# Patient Record
Sex: Male | Born: 1960 | Race: White | Hispanic: No | Marital: Married | Smoking: Never smoker
Health system: Southern US, Community
[De-identification: ages and names within clinical notes are randomized; demographics above are authoritative.]

## PROBLEM LIST (undated history)

## (undated) DIAGNOSIS — K219 Gastro-esophageal reflux disease without esophagitis: Secondary | ICD-10-CM

## (undated) DIAGNOSIS — Z978 Presence of other specified devices: Secondary | ICD-10-CM

## (undated) DIAGNOSIS — N410 Acute prostatitis: Secondary | ICD-10-CM

## (undated) DIAGNOSIS — I4891 Unspecified atrial fibrillation: Secondary | ICD-10-CM

## (undated) DIAGNOSIS — C6991 Malignant neoplasm of unspecified site of right eye: Secondary | ICD-10-CM

## (undated) DIAGNOSIS — Z960 Presence of urogenital implants: Secondary | ICD-10-CM

## (undated) DIAGNOSIS — N400 Enlarged prostate without lower urinary tract symptoms: Secondary | ICD-10-CM

## (undated) HISTORY — DX: Acute prostatitis: N41.0

## (undated) HISTORY — DX: Malignant neoplasm of unspecified site of right eye: C69.91

## (undated) HISTORY — DX: Unspecified atrial fibrillation: I48.91

---

## 2003-07-25 HISTORY — PX: ENUCLEATION: SHX628

## 2004-05-20 ENCOUNTER — Encounter: Admission: RE | Admit: 2004-05-20 | Discharge: 2004-05-20 | Payer: Self-pay | Admitting: Family Medicine

## 2004-06-23 DIAGNOSIS — C6991 Malignant neoplasm of unspecified site of right eye: Secondary | ICD-10-CM

## 2004-06-23 HISTORY — DX: Malignant neoplasm of unspecified site of right eye: C69.91

## 2004-07-08 ENCOUNTER — Ambulatory Visit (HOSPITAL_COMMUNITY): Admission: RE | Admit: 2004-07-08 | Discharge: 2004-07-08 | Payer: Self-pay | Admitting: Ophthalmology

## 2005-08-04 ENCOUNTER — Encounter: Admission: RE | Admit: 2005-08-04 | Discharge: 2005-08-04 | Payer: Self-pay | Admitting: Family Medicine

## 2006-03-12 ENCOUNTER — Encounter: Admission: RE | Admit: 2006-03-12 | Discharge: 2006-03-12 | Payer: Self-pay | Admitting: Family Medicine

## 2006-06-19 ENCOUNTER — Encounter: Admission: RE | Admit: 2006-06-19 | Discharge: 2006-06-19 | Payer: Self-pay | Admitting: General Surgery

## 2006-06-21 ENCOUNTER — Ambulatory Visit (HOSPITAL_BASED_OUTPATIENT_CLINIC_OR_DEPARTMENT_OTHER): Admission: RE | Admit: 2006-06-21 | Discharge: 2006-06-21 | Payer: Self-pay | Admitting: General Surgery

## 2008-07-24 HISTORY — PX: HERNIA REPAIR: SHX51

## 2010-09-13 ENCOUNTER — Emergency Department (HOSPITAL_COMMUNITY)
Admission: EM | Admit: 2010-09-13 | Discharge: 2010-09-13 | Disposition: A | Discharge status: Home / Self Care | Payer: BC Managed Care – PPO | Attending: Emergency Medicine | Admitting: Emergency Medicine

## 2010-09-13 DIAGNOSIS — M79609 Pain in unspecified limb: Secondary | ICD-10-CM | POA: Insufficient documentation

## 2010-09-13 DIAGNOSIS — M545 Low back pain, unspecified: Secondary | ICD-10-CM | POA: Insufficient documentation

## 2010-09-13 DIAGNOSIS — M51379 Other intervertebral disc degeneration, lumbosacral region without mention of lumbar back pain or lower extremity pain: Secondary | ICD-10-CM | POA: Insufficient documentation

## 2010-09-13 DIAGNOSIS — M5137 Other intervertebral disc degeneration, lumbosacral region: Secondary | ICD-10-CM | POA: Insufficient documentation

## 2010-09-22 ENCOUNTER — Ambulatory Visit
Admission: RE | Admit: 2010-09-22 | Discharge: 2010-09-22 | Disposition: A | Discharge status: Home / Self Care | Payer: BC Managed Care – PPO | Source: Ambulatory Visit | Attending: Family Medicine | Admitting: Family Medicine

## 2010-09-22 ENCOUNTER — Other Ambulatory Visit: Payer: Self-pay | Admitting: Family Medicine

## 2010-09-22 DIAGNOSIS — M545 Low back pain: Secondary | ICD-10-CM

## 2014-06-12 ENCOUNTER — Emergency Department: Payer: Self-pay | Admitting: Emergency Medicine

## 2014-06-21 ENCOUNTER — Emergency Department: Payer: Self-pay | Admitting: Emergency Medicine

## 2015-01-22 HISTORY — PX: TRANSTHORACIC ECHOCARDIOGRAM: SHX275

## 2015-02-04 ENCOUNTER — Emergency Department (HOSPITAL_COMMUNITY): Payer: BLUE CROSS/BLUE SHIELD

## 2015-02-04 ENCOUNTER — Emergency Department (HOSPITAL_COMMUNITY)
Admission: EM | Admit: 2015-02-04 | Discharge: 2015-02-04 | Disposition: A | Discharge status: Home / Self Care | Payer: BLUE CROSS/BLUE SHIELD | Attending: Emergency Medicine | Admitting: Emergency Medicine

## 2015-02-04 ENCOUNTER — Encounter (HOSPITAL_COMMUNITY): Payer: Self-pay | Admitting: Neurology

## 2015-02-04 DIAGNOSIS — I4891 Unspecified atrial fibrillation: Secondary | ICD-10-CM | POA: Insufficient documentation

## 2015-02-04 DIAGNOSIS — I481 Persistent atrial fibrillation: Secondary | ICD-10-CM | POA: Diagnosis not present

## 2015-02-04 DIAGNOSIS — R079 Chest pain, unspecified: Secondary | ICD-10-CM | POA: Diagnosis present

## 2015-02-04 HISTORY — DX: Unspecified atrial fibrillation: I48.91

## 2015-02-04 LAB — CBC WITH DIFFERENTIAL/PLATELET
Basophils Absolute: 0.1 10*3/uL (ref 0.0–0.1)
Basophils Relative: 1 % (ref 0–1)
Eosinophils Absolute: 0.2 10*3/uL (ref 0.0–0.7)
Eosinophils Relative: 3 % (ref 0–5)
HCT: 49.9 % (ref 39.0–52.0)
Hemoglobin: 17.9 g/dL — ABNORMAL HIGH (ref 13.0–17.0)
Lymphocytes Relative: 34 % (ref 12–46)
Lymphs Abs: 2.9 10*3/uL (ref 0.7–4.0)
MCH: 33 pg (ref 26.0–34.0)
MCHC: 35.9 g/dL (ref 30.0–36.0)
MCV: 91.9 fL (ref 78.0–100.0)
Monocytes Absolute: 1 10*3/uL (ref 0.1–1.0)
Monocytes Relative: 12 % (ref 3–12)
Neutro Abs: 4.3 10*3/uL (ref 1.7–7.7)
Neutrophils Relative %: 50 % (ref 43–77)
Platelets: 294 10*3/uL (ref 150–400)
RBC: 5.43 MIL/uL (ref 4.22–5.81)
RDW: 12.1 % (ref 11.5–15.5)
WBC: 8.6 10*3/uL (ref 4.0–10.5)

## 2015-02-04 LAB — BASIC METABOLIC PANEL
Anion gap: 12 (ref 5–15)
BUN: 12 mg/dL (ref 6–20)
CO2: 22 mmol/L (ref 22–32)
Calcium: 8.9 mg/dL (ref 8.9–10.3)
Chloride: 105 mmol/L (ref 101–111)
Creatinine, Ser: 0.97 mg/dL (ref 0.61–1.24)
GFR calc Af Amer: >60 mL/min (ref >60)
GFR calc non Af Amer: >60 mL/min (ref >60)
Glucose, Bld: 136 mg/dL — ABNORMAL HIGH (ref 65–99)
Potassium: 4.3 mmol/L (ref 3.5–5.1)
Sodium: 139 mmol/L (ref 135–145)

## 2015-02-04 LAB — TSH: TSH: 1.463 u[IU]/mL (ref 0.350–4.500)

## 2015-02-04 LAB — T4, FREE: Free T4: 0.86 ng/dL (ref 0.61–1.12)

## 2015-02-04 LAB — BRAIN NATRIURETIC PEPTIDE: B Natriuretic Peptide: 25.4 pg/mL (ref 0.0–100.0)

## 2015-02-04 LAB — URINALYSIS, ROUTINE W REFLEX MICROSCOPIC
Bilirubin Urine: NEGATIVE
Glucose, UA: NEGATIVE mg/dL
Hgb urine dipstick: NEGATIVE
Ketones, ur: NEGATIVE mg/dL
Leukocytes, UA: NEGATIVE
Nitrite: NEGATIVE
Protein, ur: NEGATIVE mg/dL
Specific Gravity, Urine: 1.009 (ref 1.005–1.030)
Urobilinogen, UA: 0.2 mg/dL (ref 0.0–1.0)
pH: 7 (ref 5.0–8.0)

## 2015-02-04 LAB — TROPONIN I: Troponin I: <0.03 ng/mL (ref <0.031)

## 2015-02-04 LAB — I-STAT TROPONIN, ED: Troponin i, poc: 0 ng/mL (ref 0.00–0.08)

## 2015-02-04 MED ORDER — SODIUM CHLORIDE 0.9 % IV BOLUS (SEPSIS)
1000.0000 mL | INTRAVENOUS | Status: AC
  Administered 2015-02-04: 1000 mL via INTRAVENOUS

## 2015-02-04 MED ORDER — FLECAINIDE ACETATE 100 MG PO TABS
300.0000 mg | ORAL_TABLET | Freq: Once | ORAL | Status: AC
  Administered 2015-02-04: 300 mg via ORAL
  Filled 2015-02-04: qty 3

## 2015-02-04 MED ORDER — DILTIAZEM HCL 25 MG/5ML IV SOLN
15.0000 mg | Freq: Once | INTRAVENOUS | Status: AC
  Administered 2015-02-04: 15 mg via INTRAVENOUS

## 2015-02-04 MED ORDER — SODIUM CHLORIDE 0.9 % IV BOLUS (SEPSIS)
500.0000 mL | Freq: Once | INTRAVENOUS | Status: AC
  Administered 2015-02-04: 500 mL via INTRAVENOUS

## 2015-02-04 MED ORDER — FLECAINIDE ACETATE 150 MG PO TABS: ORAL_TABLET | ORAL | Status: DC

## 2015-02-04 MED ORDER — DILTIAZEM HCL 25 MG/5ML IV SOLN
10.0000 mg | Freq: Once | INTRAVENOUS | Status: DC
  Filled 2015-02-04: qty 5

## 2015-02-04 MED ORDER — DEXTROSE 5 % IV SOLN
5.0000 mg/h | Freq: Once | INTRAVENOUS | Status: AC
  Administered 2015-02-04: 5 mg/h via INTRAVENOUS

## 2015-02-04 NOTE — Discharge Instructions (Signed)

## 2015-02-04 NOTE — Progress Notes (Signed)
Echocardiogram 2D Echocardiogram has been performed.  Tresa Res 02/04/2015, 1:02 PM

## 2015-02-04 NOTE — ED Notes (Signed)
HR decreased into the 100-140s after Cardizem bolus.

## 2015-02-04 NOTE — ED Notes (Signed)
Pt reports sob since 0300 last night and cp. HR 180 in triage. C/o dizziness

## 2015-02-04 NOTE — Consult Note (Signed)
Primary Physician: Primary Cardiologist:  New   Pt presents to ER for palpitations since 3 AM    HPI:  Pt is a 54 yo who has no prior history of arrhythmia or other cardiac problems  Woke up last night to go to the bathroom and heart was racing.  Came to ER for evaluation  Has not had in the past Notes drinking 3 to 4 beers per night that he does do intermittently  No prior hisotry of palpitations.  No SOB  No CP  No syncope     History reviewed. No pertinent past medical history.   (Not in a hospital admission)     Infusions:   Allergies  Allergen Reactions  . Nyquil Multi-Symptom [Pseudoeph-Doxylamine-Dm-Apap] Other (See Comments)    Intolerance- nervous    History   Social History  . Marital Status: Married    Spouse Name: N/A  . Number of Children: N/A  . Years of Education: N/A   Occupational History  . Not on file.   Social History Main Topics  . Smoking status: Never Smoker   . Smokeless tobacco: Not on file  . Alcohol Use: Yes  . Drug Use: Not on file  . Sexual Activity: Not on file   Other Topics Concern  . Not on file   Social History Narrative  . No narrative on file    No family history on file.  REVIEW OF SYSTEMS:  All systems reviewed  Negative to the above problem except as noted above.   Does have a history of prostatitis and is being evalauted for this  Has tried 2 courses of ABx  Symptoms have returned since stopping .   PHYSICAL EXAM: Filed Vitals:   02/04/15 1402  BP: 100/79  Pulse: 106  Temp:   Resp: 17     Intake/Output Summary (Last 24 hours) at 02/04/15 1416 Last data filed at 02/04/15 1022  Gross per 24 hour  Intake   1000 ml  Output      0 ml  Net   1000 ml    General:  Well appearing. No respiratory difficulty HEENT: normal Neck: supple. no JVD. Carotids 2+ bilat; no bruits. No lymphadenopathy or thryomegaly appreciated. Cor: PMI nondisplaced. Irregular rate & rhythm. No rubs, gallops or murmurs. Lungs:  clear Abdomen: soft, Mild tenderness in the pelvic region  No rebound   No hepatosplenomegaly. No bruits or masses. Good bowel sounds. Extremities: no cyanosis, clubbing, rash, edema Neuro: alert & oriented x 3, cranial nerves grossly intact. moves all 4 extremities w/o difficulty. Affect pleasant.  ECG:  Atrial fib 171 bpm    Results for orders placed or performed during the hospital encounter of 02/04/15 (from the past 24 hour(s))  CBC with Differential     Status: Abnormal   Collection Time: 02/04/15  8:15 AM  Result Value Ref Range   WBC 8.6 4.0 - 10.5 K/uL   RBC 5.43 4.22 - 5.81 MIL/uL   Hemoglobin 17.9 (H) 13.0 - 17.0 g/dL   HCT 49.9 39.0 - 52.0 %   MCV 91.9 78.0 - 100.0 fL   MCH 33.0 26.0 - 34.0 pg   MCHC 35.9 30.0 - 36.0 g/dL   RDW 12.1 11.5 - 15.5 %   Platelets 294 150 - 400 K/uL   Neutrophils Relative % 50 43 - 77 %   Neutro Abs 4.3 1.7 - 7.7 K/uL   Lymphocytes Relative 34 12 - 46 %   Lymphs Abs 2.9  0.7 - 4.0 K/uL   Monocytes Relative 12 3 - 12 %   Monocytes Absolute 1.0 0.1 - 1.0 K/uL   Eosinophils Relative 3 0 - 5 %   Eosinophils Absolute 0.2 0.0 - 0.7 K/uL   Basophils Relative 1 0 - 1 %   Basophils Absolute 0.1 0.0 - 0.1 K/uL  Brain natriuretic peptide     Status: None   Collection Time: 02/04/15  8:15 AM  Result Value Ref Range   B Natriuretic Peptide 25.4 0.0 - 100.0 pg/mL  Basic metabolic panel     Status: Abnormal   Collection Time: 02/04/15  8:15 AM  Result Value Ref Range   Sodium 139 135 - 145 mmol/L   Potassium 4.3 3.5 - 5.1 mmol/L   Chloride 105 101 - 111 mmol/L   CO2 22 22 - 32 mmol/L   Glucose, Bld 136 (H) 65 - 99 mg/dL   BUN 12 6 - 20 mg/dL   Creatinine, Ser 0.97 0.61 - 1.24 mg/dL   Calcium 8.9 8.9 - 10.3 mg/dL   GFR calc non Af Amer >60 >60 mL/min   GFR calc Af Amer >60 >60 mL/min   Anion gap 12 5 - 15  Troponin I     Status: None   Collection Time: 02/04/15  8:15 AM  Result Value Ref Range   Troponin I <0.03 <0.031 ng/mL  I-stat troponin,  ED     Status: None   Collection Time: 02/04/15  8:28 AM  Result Value Ref Range   Troponin i, poc 0.00 0.00 - 0.08 ng/mL   Comment 3          T4, free     Status: None   Collection Time: 02/04/15  8:32 AM  Result Value Ref Range   Free T4 0.86 0.61 - 1.12 ng/dL  TSH     Status: None   Collection Time: 02/04/15  8:33 AM  Result Value Ref Range   TSH 1.463 0.350 - 4.500 uIU/mL  Urinalysis, Routine w reflex microscopic (not at [facility])     Status: Abnormal   Collection Time: 02/04/15 11:35 AM  Result Value Ref Range   Color, Urine YELLOW YELLOW   APPearance CLOUDY (A) CLEAR   Specific Gravity, Urine 1.009 1.005 - 1.030   pH 7.0 5.0 - 8.0   Glucose, UA NEGATIVE NEGATIVE mg/dL   Hgb urine dipstick NEGATIVE NEGATIVE   Bilirubin Urine NEGATIVE NEGATIVE   Ketones, ur NEGATIVE NEGATIVE mg/dL   Protein, ur NEGATIVE NEGATIVE mg/dL   Urobilinogen, UA 0.2 0.0 - 1.0 mg/dL   Nitrite NEGATIVE NEGATIVE   Leukocytes, UA NEGATIVE NEGATIVE   Dg Chest Portable 1 View  02/04/2015   CLINICAL DATA:  Shortness of breath starting this morning 3 a.m.  EXAM: PORTABLE CHEST - 1 VIEW  COMPARISON:  06/19/2006  FINDINGS: Cardiomediastinal silhouette is stable. No acute infiltrate or pleural effusion. No pulmonary edema. Bony thorax is unremarkable.  IMPRESSION: No active disease.   Electronically Signed   By: Lahoma Crocker M.D.   On: 02/04/2015 08:53     ASSESSMENT:  Pt is a 54 yo with new atrial fibrillation  May have been exacerbated by EtOH ingestion last night  Thyroid function normal  Except for elevated HR exam is unremarkable.   I have reviewed echo  LV size and systolic function are normal  RVEF is normal  LA and RA are normal  No valve abnormalities   CHADSVASc of 0.  He is now on  IV diltiazem and rates are better but still in afib  Would recomm trial of flecanide 300 mg   Continue telemetry.  If does not work in converting in a couple hours would admit for further care.    Dorris Carnes

## 2015-02-04 NOTE — ED Provider Notes (Addendum)
CSN: [id number]     Arrival date & time 02/04/15  0757 History   First MD Initiated Contact with Patient 02/04/15 0813     Chief Complaint  Patient presents with  . Shortness of Breath  . Chest Pain     (Consider location/radiation/quality/duration/timing/severity/associated sxs/prior Treatment) Patient is a 54 y.o. male presenting with shortness of breath and chest pain. The history is provided by the patient.  Shortness of Breath Severity:  Mild Onset quality:  Sudden Duration:  6 hours Timing:  Constant Progression:  Unchanged Chronicity:  New Context comment:  When getting up to urinate Relieved by:  Nothing Worsened by:  Nothing tried Ineffective treatments:  None tried Associated symptoms: no abdominal pain, no chest pain, no cough, no fever, no headaches, no neck pain and no vomiting   Chest Pain Associated symptoms: dizziness, palpitations and shortness of breath   Associated symptoms: no abdominal pain, no cough, no fever, no headache, no nausea, no numbness and not vomiting     History reviewed. No pertinent past medical history. History reviewed. No pertinent past surgical history. No family history on file. History  Substance Use Topics  . Smoking status: Never Smoker   . Smokeless tobacco: Not on file  . Alcohol Use: Yes    Review of Systems  Constitutional: Negative for fever.  HENT: Negative for drooling and rhinorrhea.   Eyes: Negative for pain.  Respiratory: Positive for shortness of breath. Negative for cough.   Cardiovascular: Positive for palpitations. Negative for chest pain and leg swelling.  Gastrointestinal: Negative for nausea, vomiting, abdominal pain and diarrhea.  Genitourinary: Negative for dysuria and hematuria.  Musculoskeletal: Negative for gait problem and neck pain.  Skin: Negative for color change.  Neurological: Positive for dizziness. Negative for numbness and headaches.  Hematological: Negative for adenopathy.   Psychiatric/Behavioral: Negative for behavioral problems.  All other systems reviewed and are negative.     Allergies  Review of patient's allergies indicates no known allergies.  Home Medications   Prior to Admission medications   Not on File   BP 113/84 mmHg  Pulse 61  Temp(Src) 97.8 F (36.6 C) (Oral)  Resp 18  SpO2 100% Physical Exam  Constitutional: He is oriented to person, place, and time. He appears well-developed and well-nourished.  HENT:  Head: Normocephalic and atraumatic.  Right Ear: External ear normal.  Left Ear: External ear normal.  Nose: Nose normal.  Mouth/Throat: Oropharynx is clear and moist. No oropharyngeal exudate.  Eyes: Conjunctivae and EOM are normal. Pupils are equal, round, and reactive to light.  Neck: Normal range of motion. Neck supple.  Cardiovascular: Normal heart sounds and intact distal pulses.  Exam reveals no gallop and no friction rub.   No murmur heard. HR 160's-170's, irreg irreg  Pulmonary/Chest: Effort normal and breath sounds normal. No respiratory distress. He has no wheezes.  Abdominal: Soft. Bowel sounds are normal. He exhibits no distension. There is no tenderness. There is no rebound and no guarding.  Musculoskeletal: Normal range of motion. He exhibits no edema or tenderness.  Neurological: He is alert and oriented to person, place, and time.  Skin: Skin is warm and dry.  Psychiatric: He has a normal mood and affect. His behavior is normal.  Nursing note and vitals reviewed.   ED Course  Procedures (including critical care time) Labs Review Labs Reviewed  CBC WITH DIFFERENTIAL/PLATELET - Abnormal; Notable for the following:    Hemoglobin 17.9 (*)    All other components within normal  limits  BRAIN NATRIURETIC PEPTIDE  BASIC METABOLIC PANEL  TROPONIN I  T4, FREE  TSH  I-STAT TROPOININ, ED    Imaging Review Dg Chest Portable 1 View  02/04/2015   CLINICAL DATA:  Shortness of breath starting this morning 3 a.m.   EXAM: PORTABLE CHEST - 1 VIEW  COMPARISON:  06/19/2006  FINDINGS: Cardiomediastinal silhouette is stable. No acute infiltrate or pleural effusion. No pulmonary edema. Bony thorax is unremarkable.  IMPRESSION: No active disease.   Electronically Signed   By: Lahoma Crocker M.D.   On: 02/04/2015 08:53     EKG Interpretation   Date/Time:  Thursday February 04 2015 08:04:42 EDT Ventricular Rate:  181 PR Interval:    QRS Duration: 76 QT Interval:  250 QTC Calculation: 434 R Axis:   65 Text Interpretation:  Atrial fibrillation with rapid ventricular response  Nonspecific ST abnormality Abnormal ECG   Confirmed by Ardit Danh  MD,  Bristyn Kulesza ([id number]) on 02/04/2015 8:14:39 AM     CRITICAL CARE Performed by: Pamella Pert, S Total critical care time: 30 min Critical care time was exclusive of separately billable procedures and treating other patients. Critical care was necessary to treat or prevent imminent or life-threatening deterioration. Critical care was time spent personally by me on the following activities: development of treatment plan with patient and/or surrogate as well as nursing, discussions with consultants, evaluation of patient's response to treatment, examination of patient, obtaining history from patient or surrogate, ordering and performing treatments and interventions, ordering and review of laboratory studies, ordering and review of radiographic studies, pulse oximetry and re-evaluation of patient's condition.  MDM   Final diagnoses:  Atrial fibrillation with RVR    8:56 AM 54 y.o. male who presents with sudden onset palpitations which began around 3 AM this morning when he got up to urinate. He also notes some mild shortness of breath. He notes the palpitations were uncomfortable but denies any definite chest pain. His symptoms have been persistent since 3 AM. He is afebrile with a heart rate ranging from the 160s to 170s on exam. Vital signs are otherwise unremarkable. He states  that he has been being treated with several rounds of anti-biotics over the last few months for prostatitis.  I consulted cardiology who recommended a stat echocardiogram.  Cardiology has seen the patient and recommend flecainide. Will observe for 3 hours and if he has not converted, cardiology will admit.   Pt has converted. Cards recommends patient be prescribed a flecainide to be used when necessary palpitations at home. They will arrange follow-up for him in clinic. I recommended he return for any persistent palpitations, shortness of breath, or chest pain. He is currently asymptomatic and appears on exam. His blood pressure has improved.  Pamella Pert, MD 02/04/15 [place], MD 02/04/15 [phone]

## 2015-02-04 NOTE — ED Notes (Signed)
Cardiology at bedside.

## 2015-02-04 NOTE — ED Notes (Signed)
Echo at the bedside °

## 2015-02-04 NOTE — Progress Notes (Signed)
Pt converted to SB/SR  BP 95/    Would give fluid (IV) as he has not eaten today OK to D/C home   Would give Rx for Flecanide 300 x1  As needed I will make sure pt has f/u as outpt.  Dorris Carnes

## 2015-03-18 DIAGNOSIS — N410 Acute prostatitis: Secondary | ICD-10-CM | POA: Insufficient documentation

## 2015-03-23 ENCOUNTER — Encounter: Payer: Self-pay | Admitting: Physician Assistant

## 2015-03-23 ENCOUNTER — Ambulatory Visit (INDEPENDENT_AMBULATORY_CARE_PROVIDER_SITE_OTHER): Payer: BLUE CROSS/BLUE SHIELD | Admitting: Physician Assistant

## 2015-03-23 VITALS — BP 112/62 | HR 77 | Ht 72.0 in | Wt 152.8 lb

## 2015-03-23 DIAGNOSIS — I4891 Unspecified atrial fibrillation: Secondary | ICD-10-CM | POA: Diagnosis not present

## 2015-03-23 MED ORDER — FLECAINIDE ACETATE 150 MG PO TABS: ORAL_TABLET | ORAL | Status: DC

## 2015-03-23 NOTE — Patient Instructions (Signed)
Medication Instructions:  Your physician recommends that you continue on your current medications as directed. Please refer to the Current Medication list given to you today.   Labwork: None   Testing/Procedures: None   Follow-Up: Your physician wants you to follow-up in: 1 year with Dr.Ross You will receive a reminder letter in the mail two months in advance. If you don't receive a letter, please call our office to schedule the follow-up appointment.   Any Other Special Instructions Will Be Listed Below (If Applicable).

## 2015-03-23 NOTE — Progress Notes (Signed)
Cardiology Office Note   Date:  03/23/2015   ID:  Kurt Olson, DOB [birth year], MRN [id number]  PCP:  Simona Huh, MD  Cardiologist:  Dr. Recardo Evangelist, PA-C   Chief Complaint  Patient presents with  . Follow-up    post hosp    History of Present Illness: Kurt Olson is a 54 y.o. male with a history of prostatitis requiring a prolonged course of treatment and paroxysmal atrial fibrillation, diagnosed in July 2016.  Kurt Olson presents for follow-up.  Kurt Olson has been doing well since seen in the hospital in July. He has had one episode of palpitations that lasted about 10 minutes. He was symptomatic with them but they did not last long enough for him to take flecainide. He filled the flecainide prescription and carries the pills with him.  His prostatitis is finally under control and he is off all antibiotics. He was on moxifloxacin at the time of his ER visit and states that when he was taking that he had multiple episodes of palpitations. This will be listed as an intolerance.  He is on Flomax and is tolerating this fairly well. He gets occasional shortness of breath but has checked his heart rate with a handheld machine and notes that his heart rate has always been normal. The episodes of shortness of breath are brief and do not cause him to feel lightheaded. They resolve spontaneously.  Past Medical History  Diagnosis Date  . Acute prostatitis   . Atrial fibrillation with rapid ventricular response 02/04/2015    Paroxysmal  . Malignant melanoma of right eye    Past Surgical History  Procedure Laterality Date  . Enucleation Right     MELANOMA, has right prosthesis  . Hernia repair    . Transthoracic echocardiogram  01/2015    EF 55-60%, no WMA, no sig valve abnormalities   Current Outpatient Prescriptions  Medication Sig Dispense Refill  . flecainide (TAMBOCOR) 150 MG tablet Take 300 mg or 2 tablets by mouth as necessary for  palpitations lasting longer than 1 hr. 30 tablet 11  . tamsulosin (FLOMAX) 0.4 MG CAPS capsule Take 0.4 mg by mouth daily.     No current facility-administered medications for this visit.    Allergies:   Nyquil multi-symptom and Amoxicillin    Social History:  The patient  reports that he has never smoked. He does not have any smokeless tobacco history on file. He reports that he drinks alcohol.   Family History:  The patient's family history includes Other in his daughter, daughter, daughter, mother, and son; Stroke in his father.    ROS:  Please see the history of present illness. All other systems are reviewed and negative.    PHYSICAL EXAM: VS:  BP 112/62 mmHg  Pulse 77  Ht 6' (1.829 m)  Wt 152 lb 12.8 oz (69.31 kg)  BMI 20.72 kg/m2  SpO2 97% , BMI Body mass index is 20.72 kg/(m^2). GEN: Well nourished, well developed, in no acute distress HEENT: normal Neck: no JVD, carotid bruits, or masses Cardiac: RRR; no murmurs, rubs, or gallops,no edema  Respiratory:  clear to auscultation bilaterally, normal work of breathing GI: soft, nontender, nondistended, + BS MS: no deformity or atrophy Skin: warm and dry, no rash Neuro:  Strength and sensation are intact Psych: euthymic mood, full affect   EKG:  EKG is not ordered today.  Recent Labs: 02/04/2015: B Natriuretic Peptide 25.4; BUN 12; Creatinine, Ser 0.97;  Hemoglobin 17.9*; Platelets 294; Potassium 4.3; Sodium 139; TSH 1.463   Lipid Panel No results found for: CHOL, TRIG, HDL, CHOLHDL, VLDL, LDLCALC, LDLDIRECT   Wt Readings from Last 3 Encounters:  03/23/15 152 lb 12.8 oz (69.31 kg)     Other studies Reviewed: Additional studies/ records that were reviewed today include:   Records, previous ECGs.  ASSESSMENT AND PLAN:  1.  Atrial fibrillation, rapid ventricular response, paroxysmal: Kurt Olson has only had one episode since being evaluated in the emergency room. He is limiting alcohol and is not on the  antibiotics which he says gave him significant palpitations. His TSH was normal. His echocardiogram was also normal. No further evaluation is indicated at this time. He is to follow-up with Dr. Harrington Challenger in one year. His prescription for flecainide was renewed so that it will correlate with the timing of his appointments.  If he has any additional palpitations he was advised to make sure that he is not driving and that he is not in danger of falling. If they do not resolve quickly, he understands he is to take flecainide. He is to contact us if this happens.  Current medicines are reviewed at length with the patient today.  The patient does not have concerns regarding medicines.  The following changes have been made:  no change  Labs/ tests ordered today include:  No orders of the defined types were placed in this encounter.   Disposition:   FU with Dr Harrington Challenger in 1 year  Signed, [name]  03/23/2015 8:25 AM    [address], [place], [place]  [address] Phone: [phone]; Fax: [phone]

## 2016-06-28 ENCOUNTER — Emergency Department (HOSPITAL_BASED_OUTPATIENT_CLINIC_OR_DEPARTMENT_OTHER): Payer: BLUE CROSS/BLUE SHIELD

## 2016-06-28 ENCOUNTER — Emergency Department (HOSPITAL_BASED_OUTPATIENT_CLINIC_OR_DEPARTMENT_OTHER)
Admission: EM | Admit: 2016-06-28 | Discharge: 2016-06-28 | Disposition: A | Discharge status: Home / Self Care | Payer: BLUE CROSS/BLUE SHIELD | Attending: Emergency Medicine | Admitting: Emergency Medicine

## 2016-06-28 ENCOUNTER — Encounter (HOSPITAL_BASED_OUTPATIENT_CLINIC_OR_DEPARTMENT_OTHER): Payer: Self-pay

## 2016-06-28 DIAGNOSIS — R1030 Lower abdominal pain, unspecified: Secondary | ICD-10-CM

## 2016-06-28 DIAGNOSIS — N138 Other obstructive and reflux uropathy: Secondary | ICD-10-CM

## 2016-06-28 DIAGNOSIS — N401 Enlarged prostate with lower urinary tract symptoms: Secondary | ICD-10-CM | POA: Diagnosis not present

## 2016-06-28 LAB — CBC WITH DIFFERENTIAL/PLATELET
BASOS PCT: 1 %
Basophils Absolute: 0 10*3/uL (ref 0.0–0.1)
EOS ABS: 0.1 10*3/uL (ref 0.0–0.7)
EOS PCT: 1 %
HCT: 44.8 % (ref 39.0–52.0)
Hemoglobin: 15.7 g/dL (ref 13.0–17.0)
LYMPHS ABS: 1.5 10*3/uL (ref 0.7–4.0)
Lymphocytes Relative: 23 %
MCH: 32.6 pg (ref 26.0–34.0)
MCHC: 35 g/dL (ref 30.0–36.0)
MCV: 92.9 fL (ref 78.0–100.0)
MONO ABS: 0.7 10*3/uL (ref 0.1–1.0)
MONOS PCT: 12 %
Neutro Abs: 4 10*3/uL (ref 1.7–7.7)
Neutrophils Relative %: 63 %
Platelets: 240 10*3/uL (ref 150–400)
RBC: 4.82 MIL/uL (ref 4.22–5.81)
RDW: 11.8 % (ref 11.5–15.5)
WBC: 6.3 10*3/uL (ref 4.0–10.5)

## 2016-06-28 LAB — URINALYSIS, ROUTINE W REFLEX MICROSCOPIC
Bilirubin Urine: NEGATIVE
Glucose, UA: NEGATIVE mg/dL
Hgb urine dipstick: NEGATIVE
KETONES UR: 15 mg/dL — AB
LEUKOCYTES UA: NEGATIVE
Nitrite: NEGATIVE
PH: 7 (ref 5.0–8.0)
Protein, ur: NEGATIVE mg/dL
SPECIFIC GRAVITY, URINE: 1.009 (ref 1.005–1.030)

## 2016-06-28 LAB — COMPREHENSIVE METABOLIC PANEL
ALT: 36 U/L (ref 17–63)
ANION GAP: 7 (ref 5–15)
AST: 25 U/L (ref 15–41)
Albumin: 4.2 g/dL (ref 3.5–5.0)
Alkaline Phosphatase: 63 U/L (ref 38–126)
BUN: 9 mg/dL (ref 6–20)
CALCIUM: 9 mg/dL (ref 8.9–10.3)
CO2: 29 mmol/L (ref 22–32)
Chloride: 102 mmol/L (ref 101–111)
Creatinine, Ser: 0.75 mg/dL (ref 0.61–1.24)
GFR calc non Af Amer: >60 mL/min (ref >60)
GLUCOSE: 101 mg/dL — AB (ref 65–99)
POTASSIUM: 3.6 mmol/L (ref 3.5–5.1)
SODIUM: 138 mmol/L (ref 135–145)
Total Bilirubin: 0.7 mg/dL (ref 0.3–1.2)
Total Protein: 7.4 g/dL (ref 6.5–8.1)

## 2016-06-28 LAB — LIPASE, BLOOD: Lipase: 41 U/L (ref 11–51)

## 2016-06-28 MED ORDER — SODIUM CHLORIDE 0.9 % IV BOLUS (SEPSIS)
1000.0000 mL | Freq: Once | INTRAVENOUS | Status: AC
  Administered 2016-06-28: 1000 mL via INTRAVENOUS

## 2016-06-28 MED ORDER — IOPAMIDOL (ISOVUE-300) INJECTION 61%
100.0000 mL | Freq: Once | INTRAVENOUS | Status: AC | PRN
  Administered 2016-06-28: 100 mL via INTRAVENOUS

## 2016-06-28 NOTE — ED Triage Notes (Signed)
abd pain x 4 days-diarrhea x 1 on Monday-sent from PCP office-NAD-steady gait

## 2016-06-28 NOTE — ED Provider Notes (Signed)
[place] DEPT [facility] Provider Note   CSN: [id number] Arrival date & time: 06/28/16  1450     History   Chief Complaint Chief Complaint  Patient presents with  . Abdominal Pain    HPI Kurt Olson is a 55 y.o. male.  HPI Patient presents with gradual onset abdominal pain starting 4 days ago. States it become constant over the last day. Baseline dull ache with episodic sharp pain. Pain is worse in the lower quadrants. Patient states he has been taking occasional Aleve and ibuprofen for pain. Denies any nausea, vomiting. No blood in stool. States she had one loose bowel movement on Monday but none since. Has had previous inguinal hernia surgery but denies any other abdominal surgeries. Denies hematuria, frequency, urgency or dysuria. No fever or chills. Abdominal pain is worse with movement and palpation. Seen by his primary physician referred to the emergency department for evaluation. Past Medical History:  Diagnosis Date  . Acute prostatitis   . Atrial fibrillation with rapid ventricular response ([place]) 02/04/2015   Paroxysmal  . Malignant melanoma of right eye [facility])     Patient Active Problem List   Diagnosis Date Noted  . Atrial fibrillation with rapid ventricular response ([place]) 03/23/2015  . Acute prostatitis     Past Surgical History:  Procedure Laterality Date  . ENUCLEATION Right    MELANOMA, has right prosthesis  . HERNIA REPAIR    . TRANSTHORACIC ECHOCARDIOGRAM  01/2015   EF 55-60%, no WMA, no sig valve abnormalities       Home Medications    Prior to Admission medications   Medication Sig Start Date End Date Taking? Authorizing Provider  ibuprofen (ADVIL,MOTRIN) 800 MG tablet Take 800 mg by mouth every 8 (eight) hours as needed.   Yes Historical Provider, MD  tamsulosin (FLOMAX) 0.4 MG CAPS capsule Take 0.4 mg by mouth daily.    Historical Provider, MD    Family History Family History  Problem Relation Age of Onset  . Other Mother     GOOOD  HEALTH  . Stroke Father   . Other Daughter     [place]  . Other Daughter     [place]  . Other Daughter     [place]  . Other Son     [place]     Social History Social History  Substance Use Topics  . Smoking status: Never Smoker  . Smokeless tobacco: Never Used  . Alcohol use Yes     Comment: occ     Allergies   Nyquil multi-symptom [pseudoeph-doxylamine-dm-apap] and Amoxicillin   Review of Systems Review of Systems  Constitutional: Negative for chills and fever.  Respiratory: Negative for shortness of breath.   Cardiovascular: Negative for chest pain.  Gastrointestinal: Positive for abdominal pain. Negative for constipation, diarrhea, nausea and vomiting.  Genitourinary: Negative for dysuria, flank pain and hematuria.  Musculoskeletal: Negative for back pain, myalgias, neck pain and neck stiffness.  Neurological: Negative for dizziness, weakness, light-headedness, numbness and headaches.  All other systems reviewed and are negative.    Physical Exam Updated Vital Signs BP 129/85 (BP Location: Left Arm)   Pulse 80   Temp 98 F (36.7 C) (Oral)   Resp 18   Ht 5\' 8"  (1.727 m)   Wt 160 lb (72.6 kg)   SpO2 98%   BMI 24.33 kg/m   Physical Exam  Constitutional: He is oriented to person, place, and time. He appears well-developed and well-nourished.  HENT:  Head:  Normocephalic and atraumatic.  Mouth/Throat: Oropharynx is clear and moist.  Eyes: EOM are normal. Pupils are equal, round, and reactive to light.  Neck: Normal range of motion. Neck supple.  Cardiovascular: Normal rate and regular rhythm.  Exam reveals no gallop and no friction rub.   No murmur heard. Pulmonary/Chest: Effort normal and breath sounds normal. No respiratory distress. He has no wheezes. He has no rales. He exhibits no tenderness.  Abdominal: Soft. Bowel sounds are normal. There is tenderness. There is no rebound and no guarding.  Patient has mild epigastric tenderness. His  pain appears to be most pronounced in the left lower and right lower quadrants. Rebound tenderness is present. No guarding.  Musculoskeletal: Normal range of motion. He exhibits no edema or tenderness.  No CVA tenderness.  Neurological: He is alert and oriented to person, place, and time.  Skin: Skin is warm and dry. Capillary refill takes less than 2 seconds. No rash noted. No erythema.  Psychiatric: He has a normal mood and affect. His behavior is normal.  Nursing note and vitals reviewed.    ED Treatments / Results  Labs (all labs ordered are listed, but only abnormal results are displayed) Labs Reviewed  COMPREHENSIVE METABOLIC PANEL - Abnormal; Notable for the following:       Result Value   Glucose, Bld 101 (*)    All other components within normal limits  URINALYSIS, ROUTINE W REFLEX MICROSCOPIC - Abnormal; Notable for the following:    Ketones, ur 15 (*)    All other components within normal limits  URINE CULTURE  CBC WITH DIFFERENTIAL/PLATELET  LIPASE, BLOOD    EKG  EKG Interpretation None       Radiology Ct Abdomen Pelvis W Contrast  Result Date: 06/28/2016 CLINICAL DATA:  Lower abdominal pain for 4 days.  Diarrhea. EXAM: CT ABDOMEN AND PELVIS WITH CONTRAST TECHNIQUE: Multidetector CT imaging of the abdomen and pelvis was performed using the standard protocol following bolus administration of intravenous contrast. CONTRAST:  157mL ISOVUE-300 IOPAMIDOL (ISOVUE-300) INJECTION 61% COMPARISON:  None. FINDINGS: Lower chest: The lung bases are clear.  No pleural fluid. Hepatobiliary: No focal liver abnormality is seen. No gallstones, gallbladder wall thickening, or biliary dilatation. Pancreas: Unremarkable. No pancreatic ductal dilatation or surrounding inflammatory changes. Spleen: 13 mm cyst in the lower spleen is likely a cyst 9 has benign characteristics. Normal in size. Adrenals/Urinary Tract: No adrenal nodule. Mild symmetric prominence of both proximal renal collecting  systems without frank hydronephrosis. No perinephric edema. Symmetric renal enhancement. No focal renal lesion. Bladder is distended. No bladder wall thickening. Stomach/Bowel: Small hiatal hernia. Stomach is decompressed. There is no bowel wall thickening or inflammation. No obstruction, enteric contrast is seen throughout the colon. The appendix courses in the midline and is normal. Mid sigmoid colonic tortuosity. No significant diverticular disease. Vascular/Lymphatic: No retroperitoneal adenopathy. Abdominal aorta is normal in caliber. There is a retro aortic left renal vein. No pelvic adenopathy. Reproductive: Prostate is enlarged measuring 6.3 cm transverse. Heterogeneous in density within edematous appearance. Seminal vesicles are prominent. Small amount of free fluid in the pelvis. Other: No free air. No intra-abdominal abscess. Tiny fat containing umbilical hernia. Musculoskeletal: There are no acute or suspicious osseous abnormalities. IMPRESSION: 1. Heterogeneous enlarged prostate gland, with question of prostatic edema. In conjunction with seminal vesicle prominence and trace free fluid in the pelvis, findings suggest prostatitis. 2. Mild bladder distention and prominence of the renal collecting systems, likely secondary to prostatic enlargement. Electronically Signed   By:  Jeb Levering M.D.   On: 06/28/2016 18:24    Procedures Procedures (including critical care time)  Medications Ordered in ED Medications  sodium chloride 0.9 % bolus 1,000 mL (0 mLs Intravenous Stopped 06/28/16 1929)  iopamidol (ISOVUE-300) 61 % injection 100 mL (100 mLs Intravenous Contrast Given 06/28/16 1803)     Initial Impression / Assessment and Plan / ED Course  I have reviewed the triage vital signs and the nursing notes.  Pertinent labs & imaging results that were available during my care of the patient were reviewed by me and considered in my medical decision making (see chart for details).  Clinical  Course     Concern for diverticulitis versus appendicitis. Will keep NPO. Will get CT scan CT scan without evidence of diverticulitis or appendicitis. Patient has heterogeneous prostate with distended bladder. Prostate exam demonstrates enlarged prostate that is boggy and mildly tender. Abdominal tenderness to direct palpation has resolved however there is some degree of rebound. No guarding. Discussed with Dr. Diona Fanti who reviewed the patient's CT. Advised getting a postvoid residual and if there is significant elevation in the amount of urine in the bladder placing a Foley catheter. Patient had post void residual of greater than 500 mL of urine. We'll place for catheter and reexamine abdomen.  Full catheter placed with drainage of about 500 mL of urine. Patient states he is feeling much better. Repeat abdominal exam without any abdominal tenderness and no rebound tenderness, specifically there was no tenderness, guarding or rebound tenderness over McBurney's point. Urology advises no antibiotics at this point. Urine was sent for culture. We'll discharge the patient with strict return precautions and follow-up with patient's urologist. Final Clinical Impressions(s) / ED Diagnoses   Final diagnoses:  Enlarged prostate with urinary obstruction  Lower abdominal pain    New Prescriptions New Prescriptions   No medications on file     Julianne Rice, MD 06/28/16 2138

## 2016-06-30 LAB — URINE CULTURE: Culture: NO GROWTH

## 2017-03-16 ENCOUNTER — Emergency Department (HOSPITAL_COMMUNITY)
Admission: EM | Admit: 2017-03-16 | Discharge: 2017-03-16 | Disposition: A | Discharge status: Home / Self Care | Payer: BLUE CROSS/BLUE SHIELD | Attending: Emergency Medicine | Admitting: Emergency Medicine

## 2017-03-16 DIAGNOSIS — R339 Retention of urine, unspecified: Secondary | ICD-10-CM | POA: Insufficient documentation

## 2017-03-16 DIAGNOSIS — Z79899 Other long term (current) drug therapy: Secondary | ICD-10-CM | POA: Insufficient documentation

## 2017-03-16 LAB — URINALYSIS, ROUTINE W REFLEX MICROSCOPIC
Bilirubin Urine: NEGATIVE
Glucose, UA: NEGATIVE mg/dL
Ketones, ur: NEGATIVE mg/dL
Leukocytes, UA: NEGATIVE
Nitrite: NEGATIVE
PROTEIN: NEGATIVE mg/dL
Specific Gravity, Urine: 1.002 — ABNORMAL LOW (ref 1.005–1.030)
Squamous Epithelial / LPF: NONE SEEN
pH: 6 (ref 5.0–8.0)

## 2017-03-16 LAB — BASIC METABOLIC PANEL
ANION GAP: 14 (ref 5–15)
BUN: 10 mg/dL (ref 6–20)
CHLORIDE: 105 mmol/L (ref 101–111)
CO2: 21 mmol/L — ABNORMAL LOW (ref 22–32)
CREATININE: 0.88 mg/dL (ref 0.61–1.24)
Calcium: 9 mg/dL (ref 8.9–10.3)
GFR calc Af Amer: >60 mL/min (ref >60)
GFR calc non Af Amer: >60 mL/min (ref >60)
Glucose, Bld: 110 mg/dL — ABNORMAL HIGH (ref 65–99)
Potassium: 3.9 mmol/L (ref 3.5–5.1)
Sodium: 140 mmol/L (ref 135–145)

## 2017-03-16 NOTE — ED Provider Notes (Signed)
[place] DEPT Provider Note   CSN: [id number] Arrival date & time: 03/16/17  0213     History   Chief Complaint Chief Complaint  Patient presents with  . Urinary Retention    HPI Kurt Olson is a 56 y.o. male.  Patient presents emergency department with chief complaint of urinary retention. He reports having this problem before. He states that he has been unable to urinate since this evening after becoming "stressed out at a baseball game."  He denies any fevers chills. Denies any nausea or vomiting. States that he originally had significant pain, but after the Foley catheter was placed, he has had resolution of his symptoms. He denies any other associated symptoms. Denies any modifying factors. He is followed by [facility].   The history is provided by the patient. No language interpreter was used.    Past Medical History:  Diagnosis Date  . Acute prostatitis   . Atrial fibrillation with rapid ventricular response ([place]) 02/04/2015   Paroxysmal  . Malignant melanoma of right eye [facility])     Patient Active Problem List   Diagnosis Date Noted  . Atrial fibrillation with rapid ventricular response ([place]) 03/23/2015  . Acute prostatitis     Past Surgical History:  Procedure Laterality Date  . ENUCLEATION Right    MELANOMA, has right prosthesis  . HERNIA REPAIR    . TRANSTHORACIC ECHOCARDIOGRAM  01/2015   EF 55-60%, no WMA, no sig valve abnormalities       Home Medications    Prior to Admission medications   Medication Sig Start Date End Date Taking? Authorizing Provider  tamsulosin (FLOMAX) 0.4 MG CAPS capsule Take 0.4 mg by mouth daily.   Yes [provider]    Family History Family History  Problem Relation Age of Onset  . Other Mother        G[facility] HE[employer]  . Stroke Father   . Other Daughter        [place]  . Other Daughter        [place]  . Other Daughter        [place]  . Other Son        [place]     Social  History Social History  Substance Use Topics  . Smoking status: Never Smoker  . Smokeless tobacco: Never Used  . Alcohol use Yes     Comment: occ     Allergies   Nyquil multi-symptom [pseudoeph-doxylamine-dm-apap] and Amoxicillin   Review of Systems Review of Systems  All other systems reviewed and are negative.    Physical Exam Updated Vital Signs BP 110/69   Pulse (!) 102   Temp 98.1 F (36.7 C) (Oral)   Resp 15   SpO2 97%   Physical Exam  Constitutional: He is oriented to person, place, and time. He appears well-developed and well-nourished.  HENT:  Head: Normocephalic and atraumatic.  Eyes: Pupils are equal, round, and reactive to light. Conjunctivae and EOM are normal. Right eye exhibits no discharge. Left eye exhibits no discharge. No scleral icterus.  Neck: Normal range of motion. Neck supple. No JVD present.  Cardiovascular: Normal rate, regular rhythm and normal heart sounds.  Exam reveals no gallop and no friction rub.   No murmur heard. Pulmonary/Chest: Effort normal and breath sounds normal. No respiratory distress. He has no wheezes. He has no rales. He exhibits no tenderness.  Abdominal: Soft. He exhibits no distension and no mass. There is no tenderness. There  is no rebound and no guarding.  Musculoskeletal: Normal range of motion. He exhibits no edema or tenderness.  Neurological: He is alert and oriented to person, place, and time.  Skin: Skin is warm and dry.  Psychiatric: He has a normal mood and affect. His behavior is normal. Judgment and thought content normal.  Nursing note and vitals reviewed.    ED Treatments / Results  Labs (all labs ordered are listed, but only abnormal results are displayed) Labs Reviewed  URINALYSIS, ROUTINE W REFLEX MICROSCOPIC - Abnormal; Notable for the following:       Result Value   Color, Urine STRAW (*)    Specific Gravity, Urine 1.002 (*)    Hgb urine dipstick LARGE (*)    Bacteria, UA RARE (*)    All other  components within normal limits  I-STAT CHEM 8, ED - Abnormal; Notable for the following:    Sodium 134 (*)    Potassium 7.7 (*)    Glucose, Bld 117 (*)    Calcium, Ion 0.74 (*)    TCO2 19 (*)    All other components within normal limits  BASIC METABOLIC PANEL    EKG  EKG Interpretation None       Radiology No results found.  Procedures Procedures (including critical care time)  Medications Ordered in ED Medications - No data to display   Initial Impression / Assessment and Plan / ED Course  I have reviewed the triage vital signs and the nursing notes.  Pertinent labs & imaging results that were available during my care of the patient were reviewed by me and considered in my medical decision making (see chart for details).     Patient with urinary retention, thought secondary to BPH. He has a history of the same. Foley catheter placed. Patient was retaining over 900 mL of urine. He has had complete resolution of his symptoms, and feels well now. He does state that he had some left flank pain, will check chem 8 to evaluate creatinine. Urinalysis is inconsistent with UTI.  Chemistry shows potassium of 7.7, I suspect that this has hemolyzed, as there is no creatinine change. Will recheck potassium and EKG. If normal, discharged home.  Repeat K is 3.9. Final Clinical Impressions(s) / ED Diagnoses   Final diagnoses:  Urinary retention    New Prescriptions New Prescriptions   No medications on file     Montine Circle, Hershal Coria 03/16/17 [place], Delice Bison, DO 03/16/17 [phone]

## 2017-03-16 NOTE — ED Notes (Signed)
ED Provider at bedside. 

## 2017-03-16 NOTE — ED Triage Notes (Signed)
Per ems pt stated he has had severe lower abdominal pain and not been able to urinate for several hours. Took hydrocodone and no relief. 136/91 p 104,rr 16, 98% ra

## 2017-03-16 NOTE — ED Triage Notes (Signed)
Bladder scan shows greater than 973ML. Foley placed by this RN

## 2017-03-19 LAB — I-STAT CHEM 8, ED
BUN: 15 mg/dL (ref 6–20)
CALCIUM ION: 0.74 mmol/L — AB (ref 1.15–1.40)
Chloride: 110 mmol/L (ref 101–111)
Creatinine, Ser: 0.8 mg/dL (ref 0.61–1.24)
GLUCOSE: 117 mg/dL — AB (ref 65–99)
HCT: 45 % (ref 39.0–52.0)
HEMOGLOBIN: 15.3 g/dL (ref 13.0–17.0)
Potassium: 7.7 mmol/L (ref 3.5–5.1)
SODIUM: 134 mmol/L — AB (ref 135–145)
TCO2: 19 mmol/L — AB (ref 22–32)

## 2017-03-24 ENCOUNTER — Encounter (HOSPITAL_COMMUNITY): Payer: Self-pay | Admitting: Nurse Practitioner

## 2017-03-24 ENCOUNTER — Emergency Department (HOSPITAL_COMMUNITY)
Admission: EM | Admit: 2017-03-24 | Discharge: 2017-03-25 | Disposition: A | Discharge status: Home / Self Care | Payer: 59 | Attending: Emergency Medicine | Admitting: Emergency Medicine

## 2017-03-24 DIAGNOSIS — R338 Other retention of urine: Secondary | ICD-10-CM | POA: Insufficient documentation

## 2017-03-24 DIAGNOSIS — Z85820 Personal history of malignant melanoma of skin: Secondary | ICD-10-CM | POA: Insufficient documentation

## 2017-03-24 DIAGNOSIS — Z79899 Other long term (current) drug therapy: Secondary | ICD-10-CM | POA: Insufficient documentation

## 2017-03-24 NOTE — ED Triage Notes (Signed)
Pt states he recently had his foley catheter removed. We have bladder scanned him as we triaged him and obtained a >999 ml reading on bladder scanner. Pt rushed to the a room for catheter insertion. Hx of enlarged prostate and urinary retention problems.

## 2017-03-24 NOTE — ED Provider Notes (Signed)
[place] DEPT Provider Note   CSN: [id number] Arrival date & time: 03/24/17  2210     History   Chief Complaint Chief Complaint  Patient presents with  . Urinary Retention    HPI Kurt Olson is a 56 y.o. male past medical history of BPH, afib with RVR, presenting to ED with acute urinary retention since 1200 today. Patient with recent ED visit on 03/16/2017 for similar complaint, followed up with his urologist, McKenzie with [facility], on 03/22/2017 where Foley was removed. At that time patient states he was febrile in the clinic, and was started on oral Bactrim antibiotics which he has been taking as prescribed since that time. He states he reported back to his urologist on Friday and was diagnosed with prostatitis and given 2 shots, which was he was unable to name. Patient reports today since 1200 he has been unable to urinate with worsening suprapubic abdominal discomfort and spasms, as well as mild low back pain. He states he called his urology office who recommended he try an enema tablet bowel movement to release pressure, however he states this was unsuccessful he has not been able to urinate. He denies fever, chills, flank pain, N/V, or any other symptoms.   The history is provided by the patient.    Past Medical History:  Diagnosis Date  . Acute prostatitis   . Atrial fibrillation with rapid ventricular response ([place]) 02/04/2015   Paroxysmal  . Malignant melanoma of right eye [facility])    Patient Active Problem List   Diagnosis Date Noted  . Atrial fibrillation with rapid ventricular response ([place]) 03/23/2015  . Acute prostatitis     Past Surgical History:  Procedure Laterality Date  . ENUCLEATION Right    MELANOMA, has right prosthesis  . HERNIA REPAIR    . TRANSTHORACIC ECHOCARDIOGRAM  01/2015   EF 55-60%, no WMA, no sig valve abnormalities       Home Medications    Prior to Admission medications   Medication Sig Start Date End Date Taking?  Authorizing Provider  tamsulosin (FLOMAX) 0.4 MG CAPS capsule Take 0.4 mg by mouth daily.    [provider]    Family History Family History  Problem Relation Age of Onset  . Other Mother        G[employer] HE[employer]  . Stroke Father   . Other Daughter        [place]  . Other Daughter        [place]  . Other Daughter        [place]  . Other Son        [place]     Social History Social History  Substance Use Topics  . Smoking status: Never Smoker  . Smokeless tobacco: Never Used  . Alcohol use Yes     Comment: occ     Allergies   Nyquil multi-symptom [pseudoeph-doxylamine-dm-apap] and Amoxicillin   Review of Systems Review of Systems  Constitutional: Negative for chills and fever.  Gastrointestinal: Positive for abdominal pain (suprapubic).  Genitourinary: Positive for difficulty urinating. Negative for flank pain, hematuria and testicular pain.  All other systems reviewed and are negative.    Physical Exam Updated Vital Signs BP 113/82   Pulse 81   Temp 97.7 F (36.5 C) (Oral)   Resp (!) 24   Ht 6' (1.829 m)   Wt 65.5 kg (144 lb 4.8 oz)   SpO2 93%   BMI 19.57 kg/m   Physical Exam  Constitutional: He appears well-developed and well-nourished.  Appears uncomfortable  HENT:  Head: Normocephalic and atraumatic.  Eyes: Conjunctivae are normal.  Cardiovascular: Normal rate, regular rhythm, normal heart sounds and intact distal pulses.  Exam reveals no friction rub.   No murmur heard. Pulmonary/Chest: Effort normal and breath sounds normal. No respiratory distress. He has no wheezes. He has no rales.  Abdominal: Soft. Bowel sounds are normal. He exhibits distension. There is tenderness (suprapubic).  No CVA tenderness  Neurological: He is alert.  Skin: Skin is warm.  Psychiatric: He has a normal mood and affect. His behavior is normal.  Nursing note and vitals reviewed.    ED Treatments / Results  Labs (all labs ordered are listed,  but only abnormal results are displayed) Labs Reviewed - No data to display  EKG  EKG Interpretation None       Radiology No results found.  Procedures Procedures (including critical care time)  Medications Ordered in ED Medications - No data to display   Initial Impression / Assessment and Plan / ED Course  I have reviewed the triage vital signs and the nursing notes.  Pertinent labs & imaging results that were available during my care of the patient were reviewed by me and considered in my medical decision making (see chart for details).     Patient with BPH, presenting with acute urinary retention since 1200 today. Patient recently had similar issue with ED visit on 03/16/2017 with Foley catheter placed. Catheter removed by urology on Thursday, and pt placed on Bactrim for possible UTI 2/t fever in clinic. In triage today, bladder scan showing >999 mL of fluid in bladder. On initial exam, abdomen distended and tender. Foley catheter placed in ED and bladder actively draining. Follow foley placement, patient reports significant relief of symptoms, states low back pain is resolved. Abdomen soft, non-distended, and nontender on reevaluation. Pt afebrile, no sx of nausea or flank pain. Discussed recommendation for patient to be discharged with catheter in place. Leg bag given. Patient to follow-up with his urologist, Dr. Alyson Ingles with [facility], this week. Recommended patient continue taking prescribed antibiotic, Bactrim, until gone. Pt is well-appearing, safe for discharge.   Patient discussed with Dr. Eulis Foster, who agrees with care plan.  Discussed results, findings, treatment and follow up. Patient advised of return precautions. Patient verbalized understanding and agreed with plan.  Final Clinical Impressions(s) / ED Diagnoses   Final diagnoses:  Acute urinary retention    New Prescriptions New Prescriptions   No medications on file     Russo, [place] N,  PA-C 03/25/17 Willa Frater, MD 03/25/17 [phone]

## 2017-03-25 NOTE — Discharge Instructions (Signed)
Please read care instructions below. Empty the urine bag multiple times per day, as needed. Continue taking the antibiotic, as prescribed by your Urologist, until it is gone. Schedule an appointment with your urologist in 3-5 days for follow up on your visit today. Return to the ER for fever, chills, nausea, or new or concerning symptoms.

## 2017-03-28 ENCOUNTER — Other Ambulatory Visit: Payer: Self-pay | Admitting: Urology

## 2017-04-26 ENCOUNTER — Encounter (HOSPITAL_BASED_OUTPATIENT_CLINIC_OR_DEPARTMENT_OTHER): Payer: Self-pay | Admitting: *Deleted

## 2017-04-26 NOTE — Progress Notes (Signed)
Npo after midnight arrive 945 am 04-30-17 [facility] ekg 03-20-17 on chart and in epic, echo 02-04-15 on chart and in epic . Needs I stat 4

## 2017-04-30 ENCOUNTER — Encounter (HOSPITAL_BASED_OUTPATIENT_CLINIC_OR_DEPARTMENT_OTHER): Payer: Self-pay

## 2017-04-30 ENCOUNTER — Encounter (HOSPITAL_BASED_OUTPATIENT_CLINIC_OR_DEPARTMENT_OTHER)
Admission: RE | Disposition: A | Discharge status: Home / Self Care | Payer: Self-pay | Source: Ambulatory Visit | Attending: Urology

## 2017-04-30 ENCOUNTER — Ambulatory Visit (HOSPITAL_BASED_OUTPATIENT_CLINIC_OR_DEPARTMENT_OTHER): Payer: 59 | Admitting: Certified Registered"

## 2017-04-30 ENCOUNTER — Observation Stay (HOSPITAL_BASED_OUTPATIENT_CLINIC_OR_DEPARTMENT_OTHER)
Admission: RE | Admit: 2017-04-30 | Discharge: 2017-05-01 | Disposition: A | Discharge status: Home / Self Care | Payer: 59 | Source: Ambulatory Visit | Attending: Urology | Admitting: Urology

## 2017-04-30 DIAGNOSIS — N401 Enlarged prostate with lower urinary tract symptoms: Principal | ICD-10-CM | POA: Insufficient documentation

## 2017-04-30 DIAGNOSIS — N400 Enlarged prostate without lower urinary tract symptoms: Secondary | ICD-10-CM | POA: Diagnosis present

## 2017-04-30 DIAGNOSIS — Z970 Presence of artificial eye: Secondary | ICD-10-CM | POA: Diagnosis not present

## 2017-04-30 DIAGNOSIS — Z79899 Other long term (current) drug therapy: Secondary | ICD-10-CM | POA: Insufficient documentation

## 2017-04-30 DIAGNOSIS — Z85840 Personal history of malignant neoplasm of eye: Secondary | ICD-10-CM | POA: Insufficient documentation

## 2017-04-30 DIAGNOSIS — R338 Other retention of urine: Secondary | ICD-10-CM | POA: Insufficient documentation

## 2017-04-30 DIAGNOSIS — N138 Other obstructive and reflux uropathy: Secondary | ICD-10-CM

## 2017-04-30 HISTORY — DX: Unspecified atrial fibrillation: I48.91

## 2017-04-30 HISTORY — DX: Presence of urogenital implants: Z96.0

## 2017-04-30 HISTORY — DX: Benign prostatic hyperplasia without lower urinary tract symptoms: N40.0

## 2017-04-30 HISTORY — DX: Presence of other specified devices: Z97.8

## 2017-04-30 HISTORY — PX: TRANSURETHRAL RESECTION OF PROSTATE: SHX73

## 2017-04-30 HISTORY — DX: Gastro-esophageal reflux disease without esophagitis: K21.9

## 2017-04-30 LAB — POCT I-STAT 4, (NA,K, GLUC, HGB,HCT)
GLUCOSE: 104 mg/dL — AB (ref 65–99)
HEMATOCRIT: 44 % (ref 39.0–52.0)
Hemoglobin: 15 g/dL (ref 13.0–17.0)
Potassium: 4 mmol/L (ref 3.5–5.1)
Sodium: 141 mmol/L (ref 135–145)

## 2017-04-30 SURGERY — TURP (TRANSURETHRAL RESECTION OF PROSTATE)
Anesthesia: General | Site: Prostate

## 2017-04-30 MED ORDER — CEFTRIAXONE SODIUM 2 G IJ SOLR
INTRAMUSCULAR | Status: AC
  Filled 2017-04-30: qty 2

## 2017-04-30 MED ORDER — HYDROMORPHONE HCL 1 MG/ML IJ SOLN
0.2500 mg | INTRAMUSCULAR | Status: DC | PRN
  Administered 2017-04-30 (×2): 0.25 mg via INTRAVENOUS
  Administered 2017-04-30: 0.5 mg via INTRAVENOUS
  Filled 2017-04-30: qty 0.5

## 2017-04-30 MED ORDER — ONDANSETRON HCL 4 MG/2ML IJ SOLN
INTRAMUSCULAR | Status: DC | PRN
  Administered 2017-04-30: 4 mg via INTRAVENOUS

## 2017-04-30 MED ORDER — DEXAMETHASONE SODIUM PHOSPHATE 10 MG/ML IJ SOLN
INTRAMUSCULAR | Status: AC
Start: 2017-04-30 — End: 2017-04-30
  Filled 2017-04-30: qty 1

## 2017-04-30 MED ORDER — DEXAMETHASONE SODIUM PHOSPHATE 4 MG/ML IJ SOLN
INTRAMUSCULAR | Status: DC | PRN
  Administered 2017-04-30: 10 mg via INTRAVENOUS

## 2017-04-30 MED ORDER — MIDAZOLAM HCL 5 MG/5ML IJ SOLN
INTRAMUSCULAR | Status: DC | PRN
  Administered 2017-04-30: 2 mg via INTRAVENOUS

## 2017-04-30 MED ORDER — ZOLPIDEM TARTRATE 5 MG PO TABS
5.0000 mg | ORAL_TABLET | Freq: Every evening | ORAL | Status: DC | PRN
  Filled 2017-04-30: qty 1

## 2017-04-30 MED ORDER — FENTANYL CITRATE (PF) 100 MCG/2ML IJ SOLN
INTRAMUSCULAR | Status: DC | PRN
Start: 2017-04-30 — End: 2017-04-30
  Administered 2017-04-30 (×2): 50 ug via INTRAVENOUS

## 2017-04-30 MED ORDER — DEXTROSE 5 % IV SOLN
INTRAVENOUS | Status: AC
  Filled 2017-04-30: qty 50

## 2017-04-30 MED ORDER — PROPOFOL 10 MG/ML IV BOLUS
INTRAVENOUS | Status: DC | PRN
  Administered 2017-04-30: 200 mg via INTRAVENOUS

## 2017-04-30 MED ORDER — SODIUM CHLORIDE 0.9 % IV SOLN
INTRAVENOUS | Status: DC
  Administered 2017-04-30 – 2017-05-01 (×2): via INTRAVENOUS
  Filled 2017-04-30 (×2): qty 1000

## 2017-04-30 MED ORDER — OXYCODONE-ACETAMINOPHEN 5-325 MG PO TABS
ORAL_TABLET | ORAL | Status: AC
  Filled 2017-04-30: qty 1

## 2017-04-30 MED ORDER — HYDROMORPHONE HCL-NACL 0.5-0.9 MG/ML-% IV SOSY
PREFILLED_SYRINGE | INTRAVENOUS | Status: AC
  Filled 2017-04-30: qty 1

## 2017-04-30 MED ORDER — LIDOCAINE 2% (20 MG/ML) 5 ML SYRINGE
INTRAMUSCULAR | Status: DC | PRN
  Administered 2017-04-30: 100 mg via INTRAVENOUS

## 2017-04-30 MED ORDER — OXYCODONE HCL 5 MG/5ML PO SOLN
5.0000 mg | Freq: Once | ORAL | Status: DC | PRN
  Filled 2017-04-30: qty 5

## 2017-04-30 MED ORDER — PROMETHAZINE HCL 25 MG/ML IJ SOLN
6.2500 mg | INTRAMUSCULAR | Status: DC | PRN
  Filled 2017-04-30: qty 1

## 2017-04-30 MED ORDER — ONDANSETRON HCL 4 MG/2ML IJ SOLN
4.0000 mg | INTRAMUSCULAR | Status: DC | PRN
  Filled 2017-04-30: qty 2

## 2017-04-30 MED ORDER — ONDANSETRON HCL 4 MG/2ML IJ SOLN
INTRAMUSCULAR | Status: AC
  Filled 2017-04-30: qty 2

## 2017-04-30 MED ORDER — DIPHENHYDRAMINE HCL 12.5 MG/5ML PO ELIX
12.5000 mg | ORAL_SOLUTION | Freq: Four times a day (QID) | ORAL | Status: DC | PRN
  Filled 2017-04-30: qty 10

## 2017-04-30 MED ORDER — LACTATED RINGERS IV SOLN
INTRAVENOUS | Status: DC
  Filled 2017-04-30: qty 1000

## 2017-04-30 MED ORDER — HYDROMORPHONE HCL 1 MG/ML IJ SOLN
0.5000 mg | INTRAMUSCULAR | Status: DC | PRN
  Filled 2017-04-30: qty 1

## 2017-04-30 MED ORDER — FENTANYL CITRATE (PF) 100 MCG/2ML IJ SOLN
INTRAMUSCULAR | Status: AC
  Filled 2017-04-30: qty 2

## 2017-04-30 MED ORDER — PROPOFOL 10 MG/ML IV BOLUS
INTRAVENOUS | Status: AC
  Filled 2017-04-30: qty 40

## 2017-04-30 MED ORDER — SODIUM CHLORIDE 0.9 % IR SOLN
Status: DC | PRN
  Administered 2017-04-30: 27000 mL
  Administered 2017-04-30: 16:00:00

## 2017-04-30 MED ORDER — OXYCODONE HCL 5 MG PO TABS
5.0000 mg | ORAL_TABLET | Freq: Once | ORAL | Status: DC | PRN
  Filled 2017-04-30: qty 1

## 2017-04-30 MED ORDER — MEPERIDINE HCL 25 MG/ML IJ SOLN
6.2500 mg | INTRAMUSCULAR | Status: DC | PRN
  Filled 2017-04-30: qty 1

## 2017-04-30 MED ORDER — BELLADONNA ALKALOIDS-OPIUM 16.2-60 MG RE SUPP
1.0000 | Freq: Four times a day (QID) | RECTAL | Status: DC | PRN
  Filled 2017-04-30: qty 1

## 2017-04-30 MED ORDER — OXYCODONE-ACETAMINOPHEN 5-325 MG PO TABS
1.0000 | ORAL_TABLET | ORAL | Status: DC | PRN
  Administered 2017-04-30 (×2): 1 via ORAL
  Filled 2017-04-30: qty 2

## 2017-04-30 MED ORDER — MIDAZOLAM HCL 2 MG/2ML IJ SOLN
INTRAMUSCULAR | Status: AC
  Filled 2017-04-30: qty 2

## 2017-04-30 MED ORDER — DEXTROSE 5 % IV SOLN
2.0000 g | INTRAVENOUS | Status: AC
  Administered 2017-04-30: 2 g via INTRAVENOUS
  Filled 2017-04-30: qty 2

## 2017-04-30 MED ORDER — DIPHENHYDRAMINE HCL 50 MG/ML IJ SOLN
12.5000 mg | Freq: Four times a day (QID) | INTRAMUSCULAR | Status: DC | PRN
  Filled 2017-04-30: qty 0.5

## 2017-04-30 MED ORDER — SODIUM CHLORIDE 0.9 % IV SOLN
INTRAVENOUS | Status: DC
  Administered 2017-04-30: 10:00:00 via INTRAVENOUS
  Filled 2017-04-30: qty 1000

## 2017-04-30 SURGICAL SUPPLY — 26 items
BAG DRAIN URO-CYSTO SKYTR STRL (DRAIN) ×3 IMPLANT
BAG DRN UROCATH (DRAIN) ×1
BAG URINE DRAINAGE (UROLOGICAL SUPPLIES) ×3 IMPLANT
BAG URINE LEG 19OZ MD ST LTX (BAG) IMPLANT
CATH FOLEY 3WAY 30CC 22F (CATHETERS) ×3 IMPLANT
CLOTH BEACON ORANGE TIMEOUT ST (SAFETY) ×3 IMPLANT
ELECT REM PT RETURN 9FT ADLT (ELECTROSURGICAL)
ELECTRODE REM PT RTRN 9FT ADLT (ELECTROSURGICAL) IMPLANT
EVACUATOR MICROVAS BLADDER (UROLOGICAL SUPPLIES) IMPLANT
GLOVE BIO SURGEON STRL SZ 6.5 (GLOVE) ×2 IMPLANT
GLOVE BIO SURGEON STRL SZ8 (GLOVE) ×3 IMPLANT
GLOVE BIO SURGEONS STRL SZ 6.5 (GLOVE) ×1
GLOVE BIOGEL PI IND STRL 6.5 (GLOVE) ×2 IMPLANT
GLOVE BIOGEL PI INDICATOR 6.5 (GLOVE) ×4
GOWN STRL REUS W/TWL LRG LVL3 (GOWN DISPOSABLE) ×9 IMPLANT
HOLDER FOLEY CATH W/STRAP (MISCELLANEOUS) ×3 IMPLANT
IV NS IRRIG 3000ML ARTHROMATIC (IV SOLUTION) ×27 IMPLANT
KIT RM TURNOVER CYSTO AR (KITS) ×3 IMPLANT
LOOP CUT BIPOLAR 24F LRG (ELECTROSURGICAL) ×3 IMPLANT
MANIFOLD NEPTUNE II (INSTRUMENTS) ×3 IMPLANT
PACK CYSTO (CUSTOM PROCEDURE TRAY) ×3 IMPLANT
PLUG CATH AND CAP STER (CATHETERS) IMPLANT
SYR 30ML LL (SYRINGE) ×3 IMPLANT
SYRINGE IRR TOOMEY STRL 70CC (SYRINGE) ×3 IMPLANT
TUBE CONNECTING 12'X1/4 (SUCTIONS)
TUBE CONNECTING 12X1/4 (SUCTIONS) IMPLANT

## 2017-04-30 NOTE — Progress Notes (Signed)
Received from PACU.  Pt transferred to bed w /o difficulty.  Oriented to unit.  Call bell w/in reach.

## 2017-04-30 NOTE — OR Nursing (Signed)
Foley catheter removed at 1208 pm by Judd Gaudier RN 100 ml urine discarded.

## 2017-04-30 NOTE — Anesthesia Postprocedure Evaluation (Signed)
Anesthesia Post Note  Patient: Kurt Olson  Procedure(s) Performed: TRANSURETHRAL RESECTION OF THE PROSTATE (TURP) (N/A Prostate)     Patient location during evaluation: PACU Anesthesia Type: General Level of consciousness: awake and alert Pain management: pain level controlled Vital Signs Assessment: post-procedure vital signs reviewed and stable Respiratory status: spontaneous breathing, nonlabored ventilation and respiratory function stable Cardiovascular status: blood pressure returned to baseline and stable Postop Assessment: no apparent nausea or vomiting Anesthetic complications: no    Last Vitals:  Vitals:   04/30/17 1432 04/30/17 1540  BP: 121/81 112/72  Pulse: 76 75  Resp: 12 16  Temp: 36.4 C   SpO2: 98% 96%    Last Pain:  Vitals:   04/30/17 1540  TempSrc:   PainSc: [place]

## 2017-04-30 NOTE — Progress Notes (Signed)
Foley traction removed at 2000 per MD order. Will monitor patient.

## 2017-04-30 NOTE — Anesthesia Preprocedure Evaluation (Signed)
Anesthesia Evaluation  Patient identified by MRN, date of birth, ID band Patient awake    Reviewed: Allergy & Precautions, NPO status , Patient's Chart, lab work & pertinent test results  Airway Mallampati: II  TM Distance: >3 FB Neck ROM: Full    Dental no notable dental hx. (+) Teeth Intact   Pulmonary neg pulmonary ROS,    Pulmonary exam normal breath sounds clear to auscultation       Cardiovascular Normal cardiovascular exam+ dysrhythmias Atrial Fibrillation  Rhythm:Regular Rate:Normal  Hx/o Atrial fibrillation attributed to Amoxicillin spontaneously converted. Currently in NSR   Neuro/Psych negative neurological ROS  negative psych ROS   GI/Hepatic Neg liver ROS, GERD  Medicated and Controlled,  Endo/Other  negative endocrine ROS  Renal/GU negative Renal ROS   BPH Hx/o acute prostatitis    Musculoskeletal negative musculoskeletal ROS (+)   Abdominal   Peds  Hematology negative hematology ROS (+)   Anesthesia Other Findings   Reproductive/Obstetrics                             Anesthesia Physical Anesthesia Plan  ASA: II  Anesthesia Plan: General   Post-op Pain Management:    Induction: Intravenous  PONV Risk Score and Plan: 4 or greater and Ondansetron, Dexamethasone, Midazolam, Scopolamine patch - Pre-op and Propofol infusion  Airway Management Planned: LMA  Additional Equipment:   Intra-op Plan:   Post-operative Plan: Extubation in OR  Informed Consent: I have reviewed the patients History and Physical, chart, labs and discussed the procedure including the risks, benefits and alternatives for the proposed anesthesia with the patient or authorized representative who has indicated his/her understanding and acceptance.   Dental advisory given  Plan Discussed with: CRNA, Anesthesiologist and Surgeon  Anesthesia Plan Comments:         Anesthesia Quick  Evaluation

## 2017-04-30 NOTE — Anesthesia Procedure Notes (Signed)
Procedure Name: LMA Insertion Date/Time: 04/30/2017 12:08 PM Performed by: Josephine Igo Pre-anesthesia Checklist: Patient identified, Emergency Drugs available, Suction available and Patient being monitored Patient Re-evaluated:Patient Re-evaluated prior to induction Oxygen Delivery Method: Circle system utilized Preoxygenation: Pre-oxygenation with 100% oxygen Induction Type: IV induction Ventilation: Mask ventilation without difficulty LMA: LMA inserted LMA Size: 4.0 Number of attempts: 1 Airway Equipment and Method: Bite block Placement Confirmation: positive ETCO2 Tube secured with: Tape Dental Injury: Teeth and Oropharynx as per pre-operative assessment

## 2017-04-30 NOTE — H&P (Signed)
Urology Admission H&P  Chief Complaint: urinary retention  History of Present Illness: Kurt Olson is a 56yo with a hx of BPH and urinary retention who has failed medical therapy  Past Medical History:  Diagnosis Date  . A-fib ([place])    2-3 yrs ago due to antibiotic use no problem since then  . Acute prostatitis   . Atrial fibrillation with rapid ventricular response ([facility]) 02/04/2015   Paroxysmal  . BPH (benign prostatic hyperplasia)   . Foley catheter in place    last 4 weeks  . GERD (gastroesophageal reflux disease)    no cuurent meds  . 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 ENUCLEATION Right 2005   MELANOMA, has right prosthesis  . HERNIA REPAIR  2010   inguinal   . TRANSTHORACIC ECHOCARDIOGRAM  01/2015   EF 55-60%, no WMA, no sig valve abnormalities    Home Medications:  Prescriptions Prior to Admission  Medication Sig Dispense Refill Last Dose  . ciprofloxacin (CIPRO) 500 MG tablet Take 500 mg by mouth 2 (two) times daily.   04/29/2017 at Unknown time  . tamsulosin (FLOMAX) 0.4 MG CAPS capsule Take 0.4 mg by mouth every evening.    04/29/2017 at Unknown time   Allergies:  Allergies  Allergen Reactions  . Nyquil Multi-Symptom [Pseudoeph-Doxylamine-Dm-Apap] Other (See Comments)    Intolerance- nervous  . Amoxicillin Palpitations    Family History  Problem Relation Age of Onset  . Other Mother        [facility]  . Stroke Father   . Other Daughter        [place]  . Other Daughter        [place]  . Other Daughter        [place]  . Other Son        [employer]    Social History:  reports that he has never smoked. He has never used smokeless tobacco. He reports that he drinks alcohol. He reports that he does not use drugs.  Review of Systems  Genitourinary: Positive for frequency and urgency.  All other systems reviewed and are negative.   Physical Exam:  Vital signs in last 24  hours: Temp:  [97.9 F (36.6 C)] 97.9 F (36.6 C) (10/08 0951) Pulse Rate:  [84] 84 (10/08 0951) Resp:  [16] 16 (10/08 0951) BP: (121)/(79) 121/79 (10/08 0951) SpO2:  [99 %] 99 % (10/08 0951) Weight:  [69.5 kg (153 lb 5 oz)] 69.5 kg (153 lb 5 oz) (10/08 0951) Physical Exam  Constitutional: He is oriented to person, place, and time. He appears well-developed and well-nourished.  HENT:  Head: Normocephalic and atraumatic.  Eyes: Pupils are equal, round, and reactive to light. EOM are normal.  Neck: Normal range of motion. No thyromegaly present.  Cardiovascular: Normal rate and regular rhythm.   Respiratory: Effort normal. No respiratory distress.  GI: Soft. He exhibits no distension.  Musculoskeletal: Normal range of motion. He exhibits no edema.  Neurological: He is alert and oriented to person, place, and time.  Skin: Skin is warm and dry.  Psychiatric: He has a normal mood and affect. His behavior is normal. Thought content normal.    Laboratory Data:  Results for orders placed or performed during the hospital encounter of 04/30/17 (from the past 24 hour(s))  I-STAT 4, (NA,K, GLUC, HGB,HCT)     Status: Abnormal   Collection Time: 04/30/17 10:17 AM  Result Value Ref Range   Sodium 141 135 - 145 mmol/L   Potassium 4.0 3.5 - 5.1 mmol/L   Glucose, Bld 104 (H) 65 - 99 mg/dL   HCT 44.0 39.0 - 52.0 %   Hemoglobin 15.0 13.0 - 17.0 g/dL   No results found for this or any previous visit (from the past 240 hour(s)). Creatinine: No results for input(s): CREATININE in the last 168 hours. Baseline Creatinine: unknwon  Impression/Assessment:  56yo with BPH and urinary retention  Plan:  The risks/benefits/alternatives to TURP was explained to the patient and he understands and wishes to proceed with surgery  Nicolette Bang 04/30/2017, 12:00 PM

## 2017-04-30 NOTE — Transfer of Care (Signed)
Immediate Anesthesia Transfer of Care Note  Patient: KEAVON SENSING  Procedure(s) Performed: TRANSURETHRAL RESECTION OF THE PROSTATE (TURP) (N/A Prostate)  Patient Location: PACU  Anesthesia Type:General  Level of Consciousness: awake, alert , oriented and patient cooperative  Airway & Oxygen Therapy: Patient Spontanous Breathing and Patient connected to nasal cannula oxygen  Post-op Assessment: Report given to RN and Post -op Vital signs reviewed and stable  Post vital signs: Reviewed and stable  Last Vitals:  Vitals:   04/30/17 0951  BP: 121/79  Pulse: 84  Resp: 16  Temp: 36.6 C  SpO2: 99%    Last Pain:  Vitals:   04/30/17 0951  TempSrc: Oral      Patients Stated Pain Goal: 5 (58/30/94 0768)  Complications: No apparent anesthesia complications

## 2017-05-01 ENCOUNTER — Encounter (HOSPITAL_BASED_OUTPATIENT_CLINIC_OR_DEPARTMENT_OTHER): Payer: Self-pay | Admitting: Urology

## 2017-05-01 DIAGNOSIS — N401 Enlarged prostate with lower urinary tract symptoms: Secondary | ICD-10-CM | POA: Diagnosis not present

## 2017-05-01 LAB — BASIC METABOLIC PANEL
Anion gap: 10 (ref 5–15)
BUN: 12 mg/dL (ref 6–20)
CALCIUM: 8.4 mg/dL — AB (ref 8.9–10.3)
CO2: 24 mmol/L (ref 22–32)
CREATININE: 0.84 mg/dL (ref 0.61–1.24)
Chloride: 104 mmol/L (ref 101–111)
GFR calc non Af Amer: >60 mL/min (ref >60)
Glucose, Bld: 133 mg/dL — ABNORMAL HIGH (ref 65–99)
Potassium: 4 mmol/L (ref 3.5–5.1)
Sodium: 138 mmol/L (ref 135–145)

## 2017-05-01 LAB — CBC
HCT: 39 % (ref 39.0–52.0)
Hemoglobin: 13.6 g/dL (ref 13.0–17.0)
MCH: 32 pg (ref 26.0–34.0)
MCHC: 34.9 g/dL (ref 30.0–36.0)
MCV: 91.8 fL (ref 78.0–100.0)
PLATELETS: 304 10*3/uL (ref 150–400)
RBC: 4.25 MIL/uL (ref 4.22–5.81)
RDW: 12 % (ref 11.5–15.5)
WBC: 14.9 10*3/uL — ABNORMAL HIGH (ref 4.0–10.5)

## 2017-05-01 MED ORDER — OXYCODONE-ACETAMINOPHEN 5-325 MG PO TABS: 1.0000 | ORAL_TABLET | ORAL | 0 refills | Status: DC | PRN

## 2017-05-01 NOTE — Discharge Instructions (Signed)
Call your surgeon if you experience:   1.  Fever over 101.0. 2.  Inability to urinate. 3.  Nausea and/or vomiting. 4.  Extreme swelling or bruising at the surgical site. 5.  Continued bleeding from the incision. 6.  Increased pain, redness or drainage from the incision. 7.  Problems related to your pain medication. 8.  Any problems and/or concerns       Indwelling Urinary Catheter Care, Adult Take good care of your catheter to keep it working and to prevent problems. How to wear your catheter Attach your catheter to your leg with tape (adhesive tape) or a leg strap. Make sure it is not too tight. If you use tape, remove any bits of tape that are already on the catheter. How to wear a drainage bag You should have:  A large overnight bag.  A small leg bag.  Overnight Bag You may wear the overnight bag at any time. Always keep the bag below the level of your bladder but off the floor. When you sleep, put a clean plastic bag in a wastebasket. Then hang the bag inside the wastebasket. Leg Bag Never wear the leg bag at night. Always wear the leg bag below your knee. Keep the leg bag secure with a leg strap or tape. How to care for your skin  Clean the skin around the catheter at least once every day.  Shower every day. Do not take baths.  Put creams, lotions, or ointments on your genital area only as told by your doctor.  Do not use powders, sprays, or lotions on your genital area. How to clean your catheter and your skin 1. Wash your hands with soap and water. 2. Wet a washcloth in warm water and gentle (mild) soap. 3. Use the washcloth to clean the skin where the catheter enters your body. Clean downward and wipe away from the catheter in small circles. Do not wipe toward the catheter. 4. Pat the area dry with a clean towel. Make sure to clean off all soap. How to care for your drainage bags Empty your drainage bag when it is ?- full or at least 2-3 times a day. Replace  your drainage bag once a month or sooner if it starts to smell bad or look dirty. Do not clean your drainage bag unless told by your doctor. Emptying a drainage bag  Supplies Needed  Rubbing alcohol.  Gauze pad or cotton ball.  Tape or a leg strap.  Steps 1. Wash your hands with soap and water. 2. Separate (detach) the bag from your leg. 3. Hold the bag over the toilet or a clean container. Keep the bag below your hips and bladder. This stops pee (urine) from going back into the tube. 4. Open the pour spout at the bottom of the bag. 5. Empty the pee into the toilet or container. Do not let the pour spout touch any surface. 6. Put rubbing alcohol on a gauze pad or cotton ball. 7. Use the gauze pad or cotton ball to clean the pour spout. 8. Close the pour spout. 9. Attach the bag to your leg with tape or a leg strap. 10. Wash your hands.  Changing a drainage bag Supplies Needed  Alcohol wipes.  A clean drainage bag.  Adhesive tape or a leg strap.  Steps 1. Wash your hands with soap and water. 2. Separate the dirty bag from your leg. 3. Pinch the rubber catheter with your fingers so that pee does not spill  out. 4. Separate the catheter tube from the drainage tube where these tubes connect (at the connection valve). Do not let the tubes touch any surface. 5. Clean the end of the catheter tube with an alcohol wipe. Use a different alcohol wipe to clean the end of the drainage tube. 6. Connect the catheter tube to the drainage tube of the clean bag. 7. Attach the new bag to the leg with adhesive tape or a leg strap. 8. Wash your hands.  How to prevent infection and other problems  Never pull on your catheter or try to remove it. Pulling can damage tissue in your body.  Always wash your hands before and after touching your catheter.  If a leg strap gets wet, replace it with a dry one.  Drink enough fluids to keep your pee clear or pale yellow, or as told by your  doctor.  Do not let the drainage bag or tubing touch the floor.  Wear cotton underwear.  If you are male, wipe from front to back after you poop (have a bowel movement).  Check on the catheter often to make sure it works and the tubing is not twisted. Get help if:  Your pee is cloudy.  Your pee smells unusually bad.  Your pee is not draining into the bag.  Your tube gets clogged.  Your catheter starts to leak.  Your bladder feels full. Get help right away if:  You have redness, swelling, or pain where the catheter enters your body.  You have fluid, pus, or a bad smell coming from the area where the catheter enters your body.  The area where the catheter enters your body feels warm.  You have a fever.  You have pain in your: ? Stomach (abdomen). ? Legs. ? Lower back. ? Bladder.  You see blood fill the catheter.  Your pee is pink or red.  You feel sick to your stomach (nauseous).  You throw up (vomit).  You have chills.  Your catheter gets pulled out. This information is not intended to replace advice given to you by your health care provider. Make sure you discuss any questions you have with your health care provider. Document Released: 11/04/2012 Document Revised: 06/07/2016 Document Reviewed: 12/23/2013 Elsevier Interactive Patient Education  [employer].

## 2017-05-03 NOTE — Op Note (Signed)
Preoperative diagnosis: BPH  Postoperative diagnosis: BPH  Procedure: 1 cystoscopy 2. Transurethral resection of the prostate  Attending: Nicolette Bang  Anesthesia: General  Estimated blood loss: Minimal  Drains: 22 French foley  Specimens: 1. Prostate Chips  Antibiotics: Rocephin  Findings: Trilobar prostate enlargement. Ureteral orifices in normal anatomic location.   Indications: Patient is a 56 year old male with a history of BPH and elevated PVR.  After discussing treatment options, they decided proceed with transurethral resection of the prostate.  Procedure her in detail: The patient was brought to the operating room and a brief timeout was done to ensure correct patient, correct procedure, correct site.  General anesthesia was administered patient was placed in dorsal lithotomy position.  Their genitalia was then prepped and draped in usual sterile fashion.  A rigid 67 French cystoscope was passed in the urethra and the bladder.  Bladder was inspected and we noted no masses or lesions.  the ureteral orifices were in the normal orthotopic locations. removed the cystoscope and placed a resectoscope into the bladder. We then turned our attention to the prostate resection. Using the bipolar resectoscope we resected the median lobe first from the bladder neck to the verumontanum. We then started at the 12 oclock position on the left lobe and resection to the 6 o'clock position from the bladder neck to the verumontanum. We then did the same resection of the right lobe. Once the resection was complete we then cauterized individual bleeders. We then removed the prostate chips and sent them for pathology.  We then re-inspected the prostatic fossa and found no residual bleeding.  the bladder was then drained, a 22 French foley was placed and this concluded the procedure which was well tolerated by patient.  Complications: None  Condition: Stable, extubated, transferred to PACU  Plan:  Patient is admitted overnight with continuous bladder irrigation. If their urine is clear tomorrow they will be discharged home and followup in 5 days for foley catheter removal and pathology discussion.

## 2017-05-14 NOTE — Discharge Summary (Addendum)
Physician Discharge Summary  Patient ID: Kurt Olson MRN: [id number] DOB/AGE: 11-27-1960 56 y.o.  Admit date: 04/30/2017 Discharge date: 05/01/2017  Admission Diagnoses: BPH  Discharge Diagnoses:  Active Problems:   BPH (benign prostatic hyperplasia)   Discharged Condition: good  Hospital Course: The patient tolerated the procedure well and was transferred to the floor on IV pain meds, IV fluid. On POD#1 pt was started on a regular diet and they ambulated in the halls.  Prior to discharge the pt was tolerating a regular diet, pain was controlled on PO pain meds, they were ambulating without difficulty, and they had normal bowel function.  Consults: None  Significant Diagnostic Studies: none  Treatments: surgery: TURP  Discharge Exam: Blood pressure 116/62, pulse 87, temperature 97.7 F (36.5 C), temperature source Oral, resp. rate 16, height 6' (1.829 m), weight 69.5 kg (153 lb 5 oz), SpO2 99 %. General appearance: alert, cooperative and appears stated age Head: Normocephalic, without obvious abnormality, atraumatic Nose: Nares normal. Septum midline. Mucosa normal. No drainage or sinus tenderness. Neck: no adenopathy, no carotid bruit, no JVD, supple, symmetrical, trachea midline and thyroid not enlarged, symmetric, no tenderness/mass/nodules Resp: clear to auscultation bilaterally Cardio: regular rate and rhythm, S1, S2 normal, no murmur, click, rub or gallop GI: soft, non-tender; bowel sounds normal; no masses,  no organomegaly Extremities: extremities normal, atraumatic, no cyanosis or edema Neurologic: Grossly normal  Disposition: 01-Home or Self Care  Discharge Instructions    Discharge patient    Complete by:  As directed    Discharge disposition:  01-Home or Self Care   Discharge patient date:  05/01/2017     Allergies as of 05/01/2017      Reactions   Nyquil Multi-symptom [pseudoeph-doxylamine-dm-apap] Other (See Comments)   Intolerance- nervous   Amoxicillin Palpitations      Medication List    STOP taking these medications   tamsulosin 0.4 MG Caps capsule Commonly known as:  FLOMAX     TAKE these medications   ciprofloxacin 500 MG tablet Commonly known as:  CIPRO Take 500 mg by mouth 2 (two) times daily.   oxyCODONE-acetaminophen 5-325 MG tablet Commonly known as:  PERCOCET/ROXICET Take 1-2 tablets by mouth every 4 (four) hours as needed for moderate pain.      Follow-up Information    Akeria Hedstrom, Candee Furbish, MD Follow up on 05/04/2017.   Specialty:  Urology Why:  voiding trial Contact information: [place] [address] [phone]           Signed: Nicolette Bang 05/14/2017, 11:29 AM

## 2018-05-15 IMAGING — CT CT ABD-PELV W/ CM
2 of 5 series · 16 of 46 positions shown, 18 images · IV contrast (APPLIED)
Comparison: None.

CLINICAL DATA: Lower abdominal pain for 4 days.  Diarrhea.

EXAM:
CT ABDOMEN AND PELVIS WITH CONTRAST
TECHNIQUE: Multidetector CT imaging of the abdomen and pelvis was performed
using the standard protocol following bolus administration of
intravenous contrast.
CONTRAST:  100mL 0JDGD8-RSS IOPAMIDOL (0JDGD8-RSS) INJECTION 61%

[Series 2: axial st · axial · 0.85mm/px · z∈[-592,-107]mm · 13 of 109 slices shown, 15 images]
[im 6/109  soft-tissue]
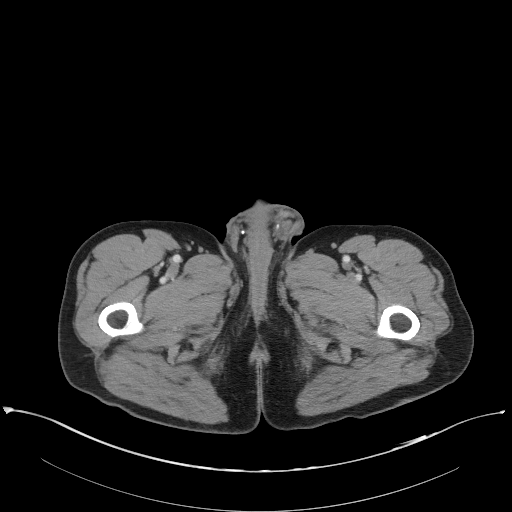
[im 6/109  bone]
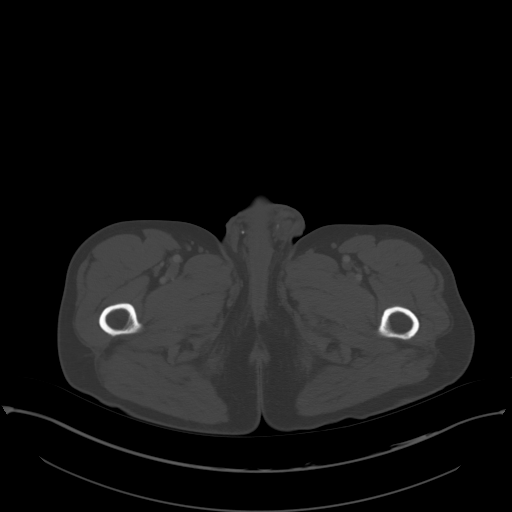
[im 18/109  soft-tissue]
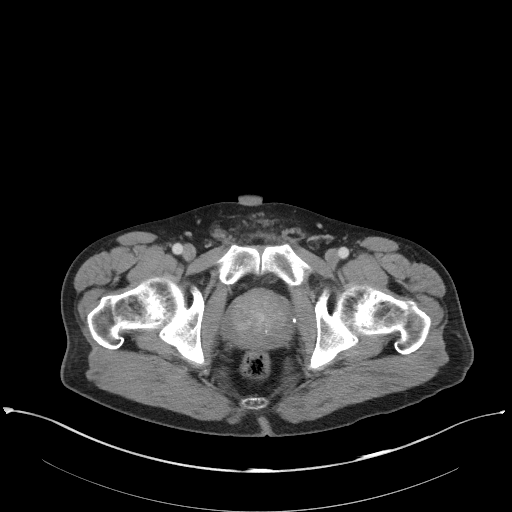
[im 23/109  soft-tissue]
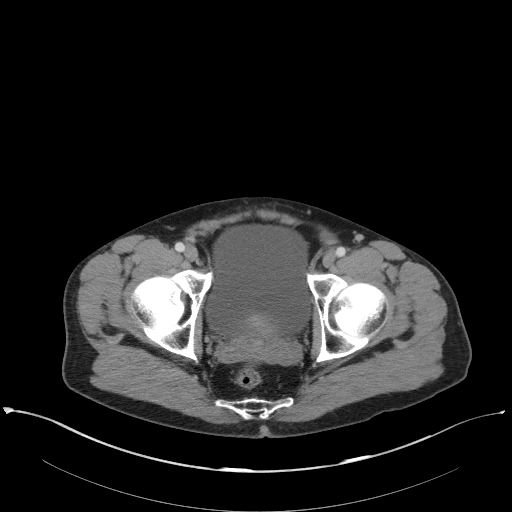
[im 29/109  soft-tissue]
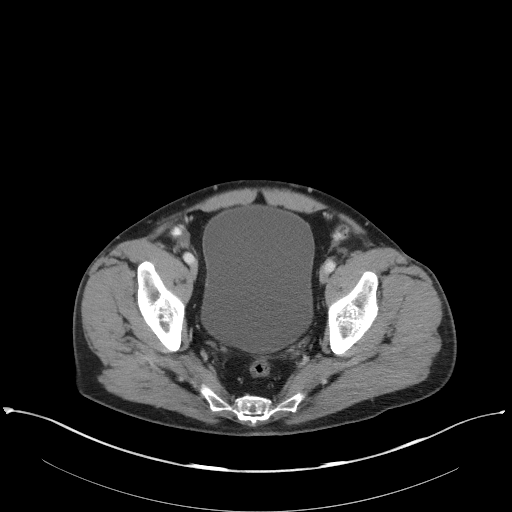
[im 40/109  soft-tissue]
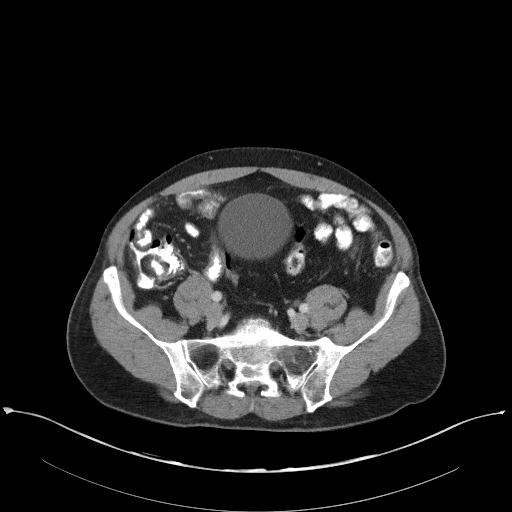
[im 46/109  soft-tissue]
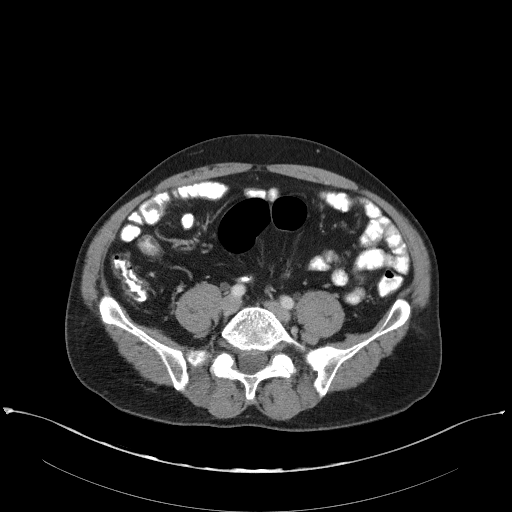
[im 57/109  soft-tissue]
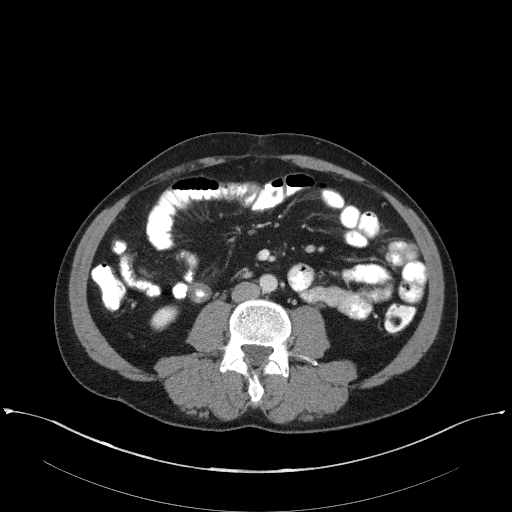
[im 63/109  soft-tissue]
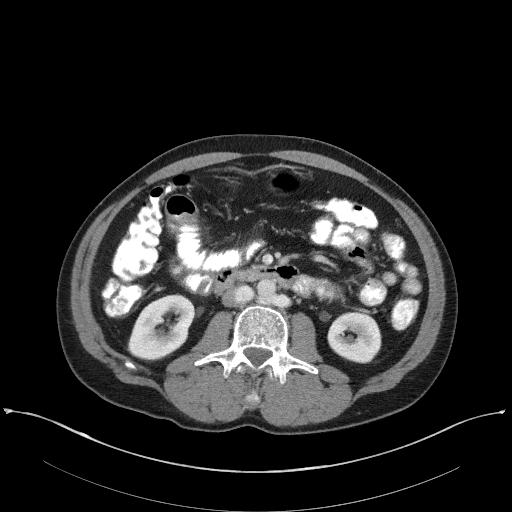
[im 69/109  soft-tissue]
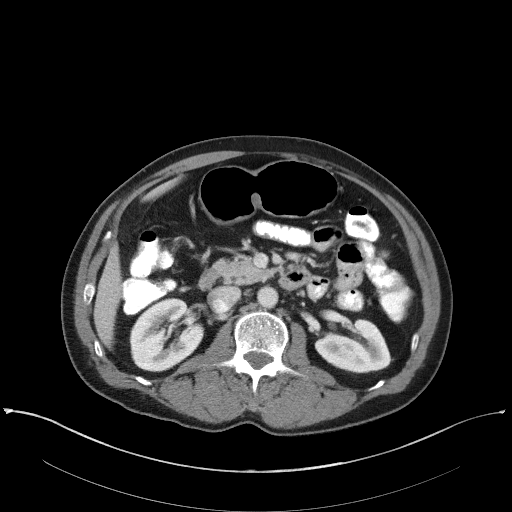
[im 69/109  bone]
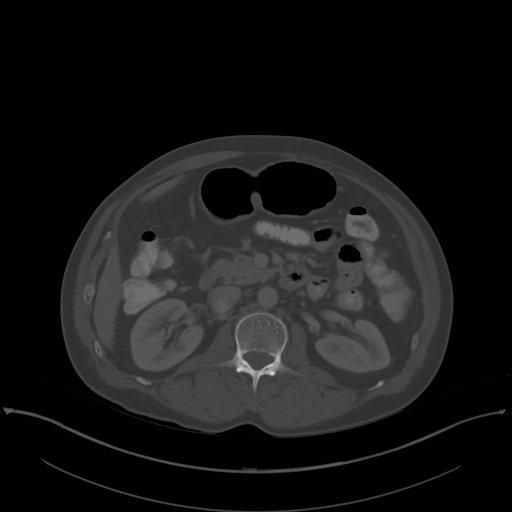
[im 80/109  soft-tissue]
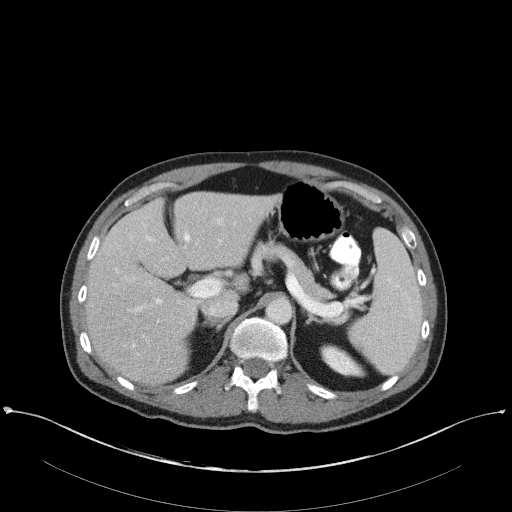
[im 86/109  soft-tissue]
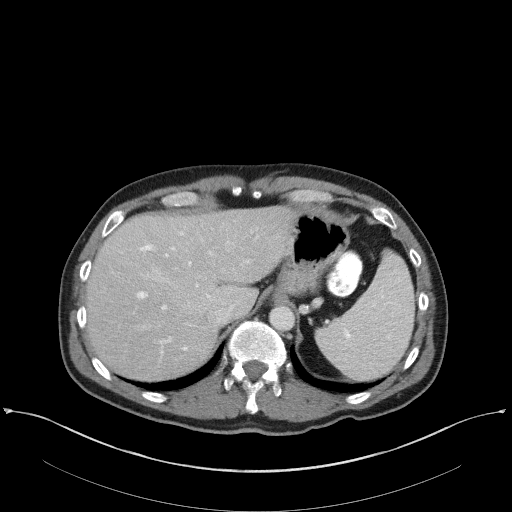
[im 91/109  soft-tissue]
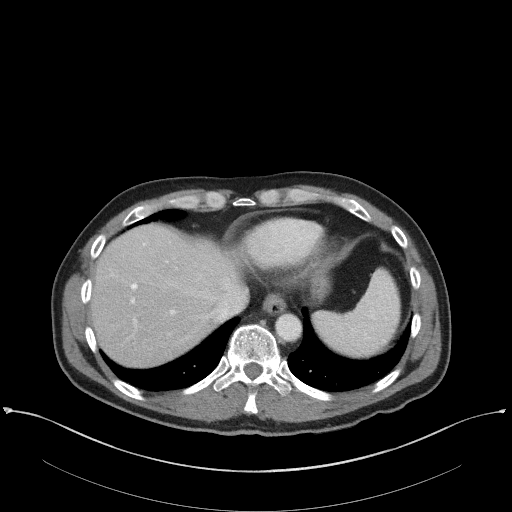
[im 103/109  soft-tissue]
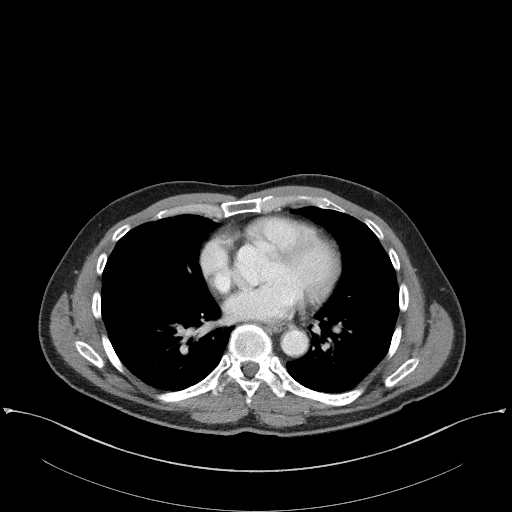

[Series 5: coronal st · coronal · 0.90mm/px · 3 of 80 slices shown]
[im 27/80  soft-tissue]
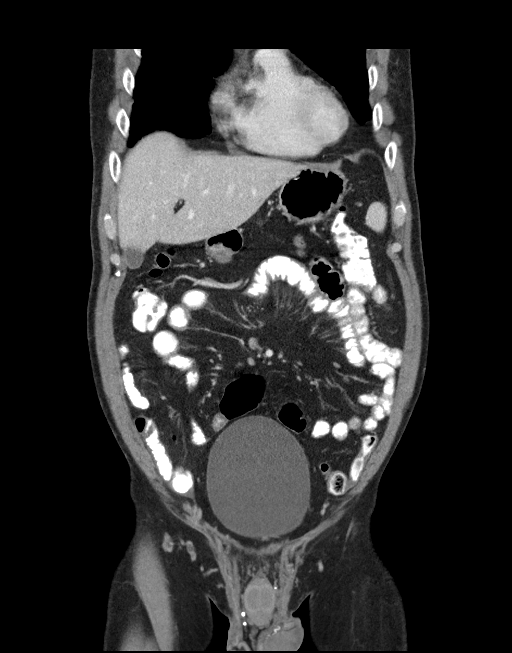
[im 36/80  soft-tissue]
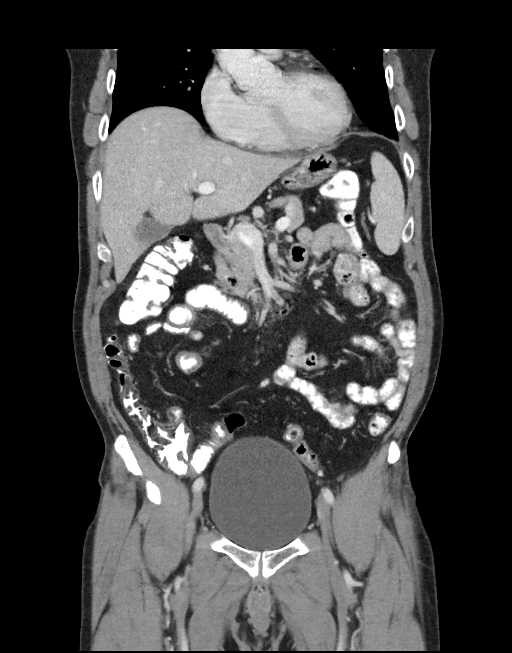
[im 44/80  soft-tissue]
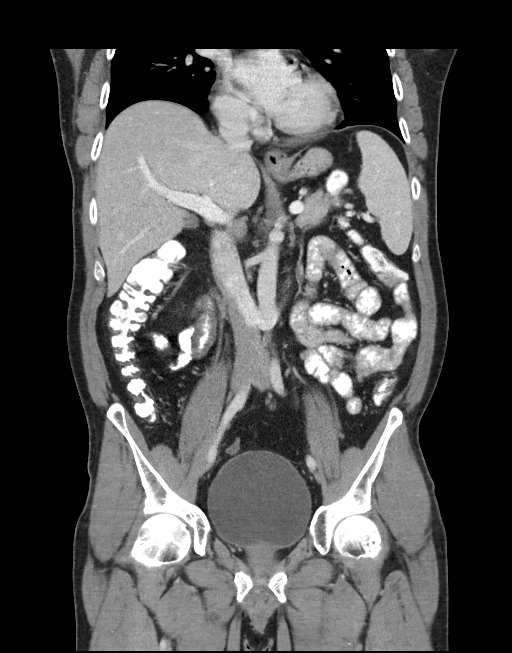

[16 of 46 positions shown; findings below may reference images not displayed]

FINDINGS: Lower chest: The lung bases are clear.  No pleural fluid.

Hepatobiliary: No focal liver abnormality is seen. No gallstones,
gallbladder wall thickening, or biliary dilatation.

Pancreas: Unremarkable. No pancreatic ductal dilatation or
surrounding inflammatory changes.

Spleen: 13 mm cyst in the lower spleen is likely a cyst 9 has benign
characteristics. Normal in size.

Adrenals/Urinary Tract: No adrenal nodule. Mild symmetric prominence
of both proximal renal collecting systems without frank
hydronephrosis. No perinephric edema. Symmetric renal enhancement.
No focal renal lesion. Bladder is distended. No bladder wall
thickening.

Stomach/Bowel: Small hiatal hernia. Stomach is decompressed. There
is no bowel wall thickening or inflammation. No obstruction, enteric
contrast is seen throughout the colon. The appendix courses in the
midline and is normal. Mid sigmoid colonic tortuosity. No
significant diverticular disease.

Vascular/Lymphatic: No retroperitoneal adenopathy. Abdominal aorta
is normal in caliber. There is a retro aortic left renal vein. No
pelvic adenopathy.

Reproductive: Prostate is enlarged measuring 6.3 cm transverse.
Heterogeneous in density within edematous appearance. Seminal
vesicles are prominent. Small amount of free fluid in the pelvis.

Other: No free air. No intra-abdominal abscess. Tiny fat containing
umbilical hernia.

Musculoskeletal: There are no acute or suspicious osseous
abnormalities.
IMPRESSION: 1. Heterogeneous enlarged prostate gland, with question of prostatic
edema. In conjunction with seminal vesicle prominence and trace free
fluid in the pelvis, findings suggest prostatitis.
2. Mild bladder distention and prominence of the renal collecting
systems, likely secondary to prostatic enlargement.

## 2021-11-06 NOTE — Progress Notes (Signed)
?Cardiology Office Note:   ? ?Date:  11/07/2021  ? ?ID:  Kurt Olson, DOB Jul 12, 1961, MRN [id number] ? ?PCP:  Scheryl Marten, PA  ?Cardiologist:  None  ? ?Referring MD: Gaynelle Arabian, MD  ? ?No chief complaint on file. ? ? ?History of Present Illness:   ? ?Kurt Olson is a 61 y.o. male with a hx of prior h/o PAF 2016 now with recurrent palpitations here for evaluation due to recurrent palpitations. ? ?He awakened last last Thursday night (actually early a.m. on Friday) and noted he was somewhat short of breath, having a dull discomfort in the chest, and had heart rate greater than 140 bpm.  On Thursday, he noticed some shortness of breath while helping move one of his parents who are dependent.  There was no actual chest discomfort.  He coincidentally had an office visit scheduled to see his physician on Friday, by the time he got into the office the rapid heart rate had resolved.  He has not felt this way before.  There is a prior history of atrial fibrillation 2016.  He does not recall how he felt with that episode.  It occurred in the setting of urinary retention. ? ?CV risk factors include family history of CVA (father) and CHF (mother).  Both are living.  Negative diabetes history, negative hypertension history, does not know his lipid status, does not smoke cigarettes, drinks alcohol 3 times per week. ? ?CHA2DS2-VASc Score = 0  ?The patient's score is based upon: ?CHF History: 0 ?HTN History: 0 ?Diabetes History: 0 ?Stroke History: 0 ?Vascular Disease History: 0 ?Age Score: 0 ?Gender Score: 0 ? ? ?Past Medical History:  ?Diagnosis Date  ? A-fib ([place])   ? 2-3 yrs ago due to antibiotic use no problem since then  ? Acute prostatitis   ? Atrial fibrillation with rapid ventricular response ([place]) 02/04/2015  ? Paroxysmal  ? BPH (benign prostatic hyperplasia)   ? Foley catheter in place   ? last 4 weeks  ? GERD (gastroesophageal reflux disease)   ? no cuurent meds  ? 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 ? ?Past Surgical History:  ?Procedure Laterality Date  ? ENUCLEATION Right 2005  ? MELANOMA, has right prosthesis  ? HERNIA REPAIR  2010  ? inguinal   ? TRANSTHORACIC ECHOCARDIOGRAM  01/2015  ? EF 55-60%, no WMA, no sig valve abnormalities  ? TRANSURETHRAL RESECTION OF PROSTATE N/A 04/30/2017  ? Procedure: TRANSURETHRAL RESECTION OF THE PROSTATE (TURP);  Surgeon: Cleon Gustin, MD;  Location[facility];  Service: Urology;  Laterality: N/A;  ? ? ?Current Medications: ?Current Meds  ?Medication Sig  ? aspirin EC 81 MG tablet Take 81 mg by mouth daily. Swallow whole.  ? flecainide (TAMBOCOR) 150 MG tablet Take 2 tablets by mouth as needed.  ? ibuprofen (ADVIL) 200 MG tablet Take 1 tablet by mouth 3 (three) times daily.  ? [DISCONTINUED] ciprofloxacin (CIPRO) 500 MG tablet Take 500 mg by mouth 2 (two) times daily.  ?  ? ?Allergies:   Nyquil multi-symptom [pseudoeph-doxylamine-dm-apap] and Amoxicillin  ? ?Social History  ? ?Socioeconomic History  ? Marital status: Married  ?  Spouse name: Not on file  ? Number of children: Not on file  ? Years of education: Not on file  ? Highest education level: Not on file  ?Occupational History  ? Occupation: Diagnostics  ?  Employer: [employer]  ?Tobacco Use  ? Smoking status: Never  ?  Smokeless tobacco: Never  ?Vaping Use  ? Vaping Use: Never used  ?Substance and Sexual Activity  ? Alcohol use: Yes  ?  Comment: occ  ? Drug use: No  ? Sexual activity: Not on file  ?Other Topics Concern  ? Not on file  ?Social History Narrative  ? Not on file  ? ?Social Determinants of Health  ? ?Financial Resource Strain: Not on file  ?Food Insecurity: Not on file  ?Transportation Needs: Not on file  ?Physical Activity: Not on file  ?Stress: Not on file  ?Social Connections: Not on file  ?  ? ?Family History: ?The patient's family history includes Other in his daughter, daughter, daughter, mother, and son; Stroke in his father. ? ?ROS:   ?Please see the  history of present illness.    ?Cares for both his mother and father who are dependent.  There is a history of malignant melanoma right eye requiring enucleation and placement of a prosthesis (2005) all other systems reviewed and are negative. ? ?EKGs/Labs/Other Studies Reviewed:   ? ?The following studies were reviewed today: ?No prior cardiac evaluation. ? ?EKG:  EKG 2018 showed NSR with normal appearance.  Today the tracing shows normal sinus rhythm, heart rate 82, normal in appearance. ? ?Recent Labs: ?No results found for requested labs within last 8760 hours.  ?Recent Lipid Panel ?No results found for: CHOL, TRIG, HDL, CHOLHDL, VLDL, LDLCALC, LDLDIRECT ? ?Physical Exam:   ? ?VS:  BP 124/88   Pulse 82   Ht 6' (1.829 m)   Wt 162 lb 6.4 oz (73.7 kg)   SpO2 96%   BMI 22.03 kg/m?    ? ?Wt Readings from Last 3 Encounters:  ?11/07/21 162 lb 6.4 oz (73.7 kg)  ?04/30/17 153 lb 5 oz (69.5 kg)  ?03/24/17 144 lb 4.8 oz (65.5 kg)  ?  ? ?GEN: Healthy appearing. No acute distress ?HEENT: Right eye prosthesis. ?NECK: No JVD. ?LYMPHATICS: No lymphadenopathy ?CARDIAC: No murmur. RRR S4 but no S3 gallop, or edema. ?VASCULAR:  Normal Pulses. No bruits. ?RESPIRATORY:  Clear to auscultation without rales, wheezing or rhonchi  ?ABDOMEN: Soft, non-tender, non-distended, No pulsatile mass, ?MUSCULOSKELETAL: No deformity  ?SKIN: Warm and dry ?NEUROLOGIC:  Alert and oriented x 3 ?PSYCHIATRIC:  Normal affect  ? ?ASSESSMENT:   ? ?1. Atrial fibrillation with rapid ventricular response ([place])   ?2. Chest pain of uncertain etiology   ? ?PLAN:   ? ?In order of problems listed above: ? ?2-week monitor to rule out asymptomatic recurrence of atrial fibrillation. ?Chest discomfort during tachycardia, few risk factors for CAD, but does have hyperlipidemia.  We will get a coronary calcium score to exclude heavy burden of undiagnosed atherosclerosis. ? ?If high risk calcium score may need functional testing.  Awaiting most recent lipid panel  performed at [facility] on Friday. ? ?Medication Adjustments/Labs and Tests Ordered: ?Current medicines are reviewed at length with the patient today.  Concerns regarding medicines are outlined above.  ?Orders Placed This Encounter  ?Procedures  ? CT CARDIAC SCORING (SELF PAY ONLY)  ? LONG TERM MONITOR (3-14 DAYS)  ? EKG 12-Lead  ? ?No orders of the defined types were placed in this encounter. ? ? ?Patient Instructions  ?Medication Instructions:  ?Your physician recommends that you continue on your current medications as directed. Please refer to the Current Medication list given to you today. ? ?*If you need a refill on your cardiac medications before your next appointment, please call your pharmacy* ? ? ?Lab Work: ?  None ?If you have labs (blood work) drawn today and your tests are completely normal, you will receive your results only by: ?MyChart Message (if you have MyChart) OR ?A paper copy in the mail ?If you have any lab test that is abnormal or we need to change your treatment, we will call you to review the results. ? ? ?Testing/Procedures: ?Your physician recommends that you have a Calcium Score performed. ? ?Your physician recommends that you wear a monitor for 2 weeks.  ? ? ?Follow-Up: ?At [facility], you and your health needs are our priority.  As part of our continuing mission to provide you with exceptional heart care, we have created designated Provider Care Teams.  These Care Teams include your primary Cardiologist (physician) and Advanced Practice Providers (APPs -  Physician Assistants and Nurse Practitioners) who all work together to provide you with the care you need, when you need it. ? ?We recommend signing up for the patient portal called "MyChart".  Sign up information is provided on this After Visit Summary.  MyChart is used to connect with patients for Virtual Visits (Telemedicine).  Patients are able to view lab/test results, encounter notes, upcoming appointments, etc.  Non-urgent messages  can be sent to your provider as well.   ?To learn more about what you can do with MyChart, go to [email or web address].   ? ?Your next appointment:   ?As needed with Dr. Tamala Julian ? ?Other Instructions ?ZIO XT- L

## 2021-11-07 ENCOUNTER — Ambulatory Visit: Payer: 59 | Admitting: Interventional Cardiology

## 2021-11-07 ENCOUNTER — Ambulatory Visit (INDEPENDENT_AMBULATORY_CARE_PROVIDER_SITE_OTHER): Payer: 59

## 2021-11-07 ENCOUNTER — Encounter: Payer: Self-pay | Admitting: Interventional Cardiology

## 2021-11-07 VITALS — BP 124/88 | HR 82 | Ht 72.0 in | Wt 162.4 lb

## 2021-11-07 DIAGNOSIS — I4891 Unspecified atrial fibrillation: Secondary | ICD-10-CM

## 2021-11-07 DIAGNOSIS — R079 Chest pain, unspecified: Secondary | ICD-10-CM | POA: Diagnosis not present

## 2021-11-07 NOTE — Progress Notes (Unsigned)
Enrolled for [employer] to mail a ZIO XT long term holter monitor to the patients address on file.  

## 2021-11-07 NOTE — Patient Instructions (Signed)
Medication Instructions:  ?Your physician recommends that you continue on your current medications as directed. Please refer to the Current Medication list given to you today. ? ?*If you need a refill on your cardiac medications before your next appointment, please call your pharmacy* ? ? ?Lab Work: ?None ?If you have labs (blood work) drawn today and your tests are completely normal, you will receive your results only by: ?MyChart Message (if you have MyChart) OR ?A paper copy in the mail ?If you have any lab test that is abnormal or we need to change your treatment, we will call you to review the results. ? ? ?Testing/Procedures: ?Your physician recommends that you have a Calcium Score performed. ? ?Your physician recommends that you wear a monitor for 2 weeks.  ? ? ?Follow-Up: ?At [facility], you and your health needs are our priority.  As part of our continuing mission to provide you with exceptional heart care, we have created designated Provider Care Teams.  These Care Teams include your primary Cardiologist (physician) and Advanced Practice Providers (APPs -  Physician Assistants and Nurse Practitioners) who all work together to provide you with the care you need, when you need it. ? ?We recommend signing up for the patient portal called "MyChart".  Sign up information is provided on this After Visit Summary.  MyChart is used to connect with patients for Virtual Visits (Telemedicine).  Patients are able to view lab/test results, encounter notes, upcoming appointments, etc.  Non-urgent messages can be sent to your provider as well.   ?To learn more about what you can do with MyChart, go to [email or web address].   ? ?Your next appointment:   ?As needed with Dr. Tamala Julian ? ?Other Instructions ?ZIO XT- Long Term Monitor Instructions ? ?Your physician has requested you wear a ZIO patch monitor for 14 days.  ?This is a single patch monitor. [employer] supplies one patch monitor per enrollment. Additional ?stickers  are not available. Please do not apply patch if you will be having a Nuclear Stress Test,  ?Echocardiogram, Cardiac CT, MRI, or Chest Xray during the period you would be wearing the  ?monitor. The patch cannot be worn during these tests. You cannot remove and re-apply the  ?ZIO XT patch monitor.  ?Your ZIO patch monitor will be mailed 3 day USPS to your address on file. It may take 3-5 days  ?to receive your monitor after you have been enrolled.  ?Once you have received your monitor, please review the enclosed instructions. Your monitor  ?has already been registered assigning a specific monitor serial # to you. ? ?Billing and Patient Assistance Program Information ? ?We have supplied [employer] with any of your insurance information on file for billing purposes. ?[employer] offers a sliding scale Patient Assistance Program for patients that do not have  ?insurance, or whose insurance does not completely cover the cost of the ZIO monitor.  ?You must apply for the Patient Assistance Program to qualify for this discounted rate.  ?To apply, please call [employer] at [phone], select option 4, select option 2, ask to apply for  ?Patient Assistance Program. Theodore Demark will ask your household income, and how many people  ?are in your household. They will quote your out-of-pocket cost based on that information.  ?[employer] will also be able to set up a 8-month interest-free payment plan if needed. ? ?Applying the monitor ?  ?Shave hair from upper left chest.  ?Hold abrader disc by orange tab. Rub abrader in 40 strokes over the upper  left chest as  ?indicated in your monitor instructions.  ?Clean area with 4 enclosed alcohol pads. Let dry.  ?Apply patch as indicated in monitor instructions. Patch will be placed under collarbone on left  ?side of chest with arrow pointing upward.  ?Rub patch adhesive wings for 2 minutes. Remove white label marked "1". Remove the white  ?label marked "2". Rub patch adhesive wings for 2 additional  minutes.  ?While looking in a mirror, press and release button in center of patch. A small green light will  ?flash 3-4 times. This will be your only indicator that the monitor has been turned on.  ?Do not shower for the first 24 hours. You may shower after the first 24 hours.  ?Press the button if you feel a symptom. You will hear a small click. Record Date, Time and  ?Symptom in the Patient Logbook.  ?When you are ready to remove the patch, follow instructions on the last 2 pages of Patient  ?Logbook. Stick patch monitor onto the last page of Patient Logbook.  ?Place Patient Logbook in the blue and white box. Use locking tab on box and tape box closed  ?securely. The blue and white box has prepaid postage on it. Please place it in the mailbox as  ?soon as possible. Your physician should have your test results approximately 7 days after the  ?monitor has been mailed back to [facility].  ?Call [facility] at [phone] if you have questions regarding  ?your ZIO XT patch monitor. Call them immediately if you see an orange light blinking on your  ?monitor.  ?If your monitor falls off in less than 4 days, contact our Monitor department at [phone].  ?If your monitor becomes loose or falls off after 4 days call [employer] at [phone] for  ?suggestions on securing your monitor ? ? ?Important Information About Sugar ? ? ? ? ?  ?

## 2021-11-09 DIAGNOSIS — I4891 Unspecified atrial fibrillation: Secondary | ICD-10-CM | POA: Diagnosis not present

## 2021-12-23 ENCOUNTER — Ambulatory Visit
Admission: RE | Admit: 2021-12-23 | Discharge: 2021-12-23 | Disposition: A | Discharge status: Home / Self Care | Payer: Self-pay | Source: Ambulatory Visit | Attending: Interventional Cardiology | Admitting: Interventional Cardiology

## 2021-12-23 DIAGNOSIS — R079 Chest pain, unspecified: Secondary | ICD-10-CM

## 2022-01-13 ENCOUNTER — Emergency Department (HOSPITAL_COMMUNITY)
Admission: EM | Admit: 2022-01-13 | Discharge: 2022-01-13 | Disposition: A | Discharge status: Home / Self Care | Payer: 59 | Attending: Emergency Medicine | Admitting: Emergency Medicine

## 2022-01-13 ENCOUNTER — Emergency Department (HOSPITAL_COMMUNITY): Payer: 59

## 2022-01-13 ENCOUNTER — Encounter (HOSPITAL_COMMUNITY): Payer: Self-pay

## 2022-01-13 ENCOUNTER — Other Ambulatory Visit: Payer: Self-pay

## 2022-01-13 DIAGNOSIS — R1032 Left lower quadrant pain: Secondary | ICD-10-CM | POA: Diagnosis not present

## 2022-01-13 DIAGNOSIS — I771 Stricture of artery: Secondary | ICD-10-CM

## 2022-01-13 DIAGNOSIS — Z85840 Personal history of malignant neoplasm of eye: Secondary | ICD-10-CM | POA: Diagnosis not present

## 2022-01-13 DIAGNOSIS — I774 Celiac artery compression syndrome: Secondary | ICD-10-CM | POA: Diagnosis not present

## 2022-01-13 DIAGNOSIS — R1012 Left upper quadrant pain: Secondary | ICD-10-CM

## 2022-01-13 DIAGNOSIS — R109 Unspecified abdominal pain: Secondary | ICD-10-CM

## 2022-01-13 DIAGNOSIS — Z7982 Long term (current) use of aspirin: Secondary | ICD-10-CM | POA: Diagnosis not present

## 2022-01-13 LAB — URINALYSIS, ROUTINE W REFLEX MICROSCOPIC
Bilirubin Urine: NEGATIVE
Glucose, UA: NEGATIVE mg/dL
Hgb urine dipstick: NEGATIVE
Ketones, ur: 5 mg/dL — AB
Leukocytes,Ua: NEGATIVE
Nitrite: NEGATIVE
Protein, ur: NEGATIVE mg/dL
Specific Gravity, Urine: 1.017 (ref 1.005–1.030)
pH: 5 (ref 5.0–8.0)

## 2022-01-13 LAB — COMPREHENSIVE METABOLIC PANEL
ALT: 45 U/L — ABNORMAL HIGH (ref 0–44)
AST: 32 U/L (ref 15–41)
Albumin: 4 g/dL (ref 3.5–5.0)
Alkaline Phosphatase: 66 U/L (ref 38–126)
Anion gap: 8 (ref 5–15)
BUN: 17 mg/dL (ref 6–20)
CO2: 25 mmol/L (ref 22–32)
Calcium: 8.9 mg/dL (ref 8.9–10.3)
Chloride: 105 mmol/L (ref 98–111)
Creatinine, Ser: 0.95 mg/dL (ref 0.61–1.24)
GFR, Estimated: >60 mL/min (ref >60)
Glucose, Bld: 110 mg/dL — ABNORMAL HIGH (ref 70–99)
Potassium: 4.3 mmol/L (ref 3.5–5.1)
Sodium: 138 mmol/L (ref 135–145)
Total Bilirubin: 0.8 mg/dL (ref 0.3–1.2)
Total Protein: 6.9 g/dL (ref 6.5–8.1)

## 2022-01-13 LAB — CBC
HCT: 47.2 % (ref 39.0–52.0)
Hemoglobin: 16.4 g/dL (ref 13.0–17.0)
MCH: 32.7 pg (ref 26.0–34.0)
MCHC: 34.7 g/dL (ref 30.0–36.0)
MCV: 94.2 fL (ref 80.0–100.0)
Platelets: 306 10*3/uL (ref 150–400)
RBC: 5.01 MIL/uL (ref 4.22–5.81)
RDW: 11.7 % (ref 11.5–15.5)
WBC: 7.1 10*3/uL (ref 4.0–10.5)
nRBC: 0 % (ref 0.0–0.2)

## 2022-01-13 LAB — LIPASE, BLOOD: Lipase: 78 U/L — ABNORMAL HIGH (ref 11–51)

## 2022-01-13 MED ORDER — DICYCLOMINE HCL 20 MG PO TABS: 20.0000 mg | ORAL_TABLET | Freq: Two times a day (BID) | ORAL | 0 refills | Status: DC

## 2022-01-13 MED ORDER — DICYCLOMINE HCL 10 MG/ML IM SOLN
20.0000 mg | Freq: Once | INTRAMUSCULAR | Status: AC
  Administered 2022-01-13: 20 mg via INTRAMUSCULAR
  Filled 2022-01-13: qty 2

## 2022-01-13 MED ORDER — FENTANYL CITRATE PF 50 MCG/ML IJ SOSY
50.0000 ug | PREFILLED_SYRINGE | Freq: Once | INTRAMUSCULAR | Status: AC
  Administered 2022-01-13: 50 ug via INTRAVENOUS
  Filled 2022-01-13: qty 1

## 2022-01-13 MED ORDER — PANTOPRAZOLE SODIUM 20 MG PO TBEC: 40.0000 mg | DELAYED_RELEASE_TABLET | Freq: Every day | ORAL | 0 refills | Status: DC

## 2022-01-13 MED ORDER — LIDOCAINE VISCOUS HCL 2 % MT SOLN
15.0000 mL | Freq: Once | OROMUCOSAL | Status: AC
  Administered 2022-01-13: 15 mL via ORAL
  Filled 2022-01-13: qty 15

## 2022-01-13 MED ORDER — IOHEXOL 350 MG/ML SOLN
100.0000 mL | Freq: Once | INTRAVENOUS | Status: AC | PRN
  Administered 2022-01-13: 100 mL via INTRAVENOUS

## 2022-01-13 MED ORDER — ALUM & MAG HYDROXIDE-SIMETH 200-200-20 MG/5ML PO SUSP
30.0000 mL | Freq: Once | ORAL | Status: AC
  Administered 2022-01-13: 30 mL via ORAL
  Filled 2022-01-13: qty 30

## 2022-01-13 MED ORDER — LACTATED RINGERS IV BOLUS
1000.0000 mL | Freq: Once | INTRAVENOUS | Status: AC
  Administered 2022-01-13: 1000 mL via INTRAVENOUS

## 2022-01-13 NOTE — ED Triage Notes (Signed)
Reports left mid abdominal pain x 4 days. Denies nvd.

## 2022-01-13 NOTE — ED Notes (Signed)
Pt A&O x4 in room c/o Lower L ABD pain. Pt denies N/VD, CP and SOB. Tender to touch. No physical signs of bruising. Pt denies Injury

## 2022-02-07 ENCOUNTER — Ambulatory Visit: Payer: 59 | Admitting: Vascular Surgery

## 2022-02-07 ENCOUNTER — Encounter: Payer: Self-pay | Admitting: Vascular Surgery

## 2022-02-07 DIAGNOSIS — I771 Stricture of artery: Secondary | ICD-10-CM

## 2022-02-07 NOTE — Progress Notes (Signed)
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  Patient was in the ED for evaluation of left flank pain.  This started several months ago and the pain is on the back of his rib cage on the left side and he noticed it when he was pushing a heavy pot as well as lifting some heavy things.  The celiac artery stenosis was identified on CT in the ED.  He denies any nausea or vomiting.  He has no abdominal pain.  No postprandial pain.  Looks forward to eating.  Weight stable.  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  Past Medical History:  Diagnosis Date   A-fib [facility])    2-3 yrs ago due to antibiotic use no problem since then   Acute prostatitis    Atrial fibrillation with rapid ventricular response ([place]) 02/04/2015   Paroxysmal   BPH (benign prostatic hyperplasia)    Foley catheter in place    last 4 weeks   GERD (gastroesophageal reflux disease)    no cuurent meds   Malignant melanoma of right eye ([place]) 06/2004    Past Surgical History:  Procedure Laterality Date   ENUCLEATION Right 2005   MELANOMA, has right prosthesis   HERNIA REPAIR  2010   inguinal    TRANSTHORACIC ECHOCARDIOGRAM  01/2015   EF 55-60%, no WMA, no sig valve abnormalities   TRANSURETHRAL RESECTION OF PROSTATE N/A 04/30/2017   Procedure: TRANSURETHRAL RESECTION OF THE PROSTATE (TURP);  Surgeon: Cleon Gustin, MD;  Location[facility];  Service: Urology;  Laterality: N/A;    Family History  Problem Relation Age of Onset   Other Mother        [place]   Stroke Father    Other Daughter         [facility]   Other Daughter        [employer]   Other Daughter        [facility]   Other Son        [employer] [facility]     SOCIAL HISTORY: Social History   Socioeconomic History   Marital status: Married    Spouse name: Not on file   Number of children: Not on file   Years of education: Not on file   Highest education level: Not on file  Occupational History   Occupation: Environmental education officer: [employer]  Tobacco Use   Smoking status: Never   Smokeless tobacco: Never  Vaping Use   Vaping Use: Never used  Substance and Sexual Activity   Alcohol use: Yes    Comment: occ   Drug use: No   Sexual activity: Not on file  Other Topics Concern   Not on file  Social History Narrative   Not on file   Social Determinants of Health   Financial Resource Strain: Not on file  Food Insecurity: Not on file  Transportation Needs: Not on file  Physical Activity: Not on file  Stress: Not on file  Social Connections: Not on file  Intimate Partner Violence: Not on file  Allergies  Allergen Reactions   Nyquil Multi-Symptom [Pseudoeph-Doxylamine-Dm-Apap] Other (See Comments)    Intolerance- nervous   Amoxicillin Palpitations    Current Outpatient Medications  Medication Sig Dispense Refill   acetaminophen (TYLENOL) 325 MG tablet Take 650 mg by mouth every 6 (six) hours as needed for mild pain.     aspirin EC 81 MG tablet Take 81 mg by mouth daily. Swallow whole.     dicyclomine (BENTYL) 20 MG tablet Take 1 tablet (20 mg total) by mouth 2 (two) times daily. (Patient not taking: Reported on 02/07/2022) 20 tablet 0   ibuprofen (ADVIL) 200 MG tablet Take 1 tablet by mouth 3 (three) times daily. (Patient not taking: Reported on 02/07/2022)     pantoprazole (PROTONIX) 20 MG tablet Take 2 tablets (40 mg total) by mouth daily for 14 days. 28 tablet 0   No current facility-administered medications for this visit.    REVIEW OF SYSTEMS:  '[X]'$  denotes positive finding, '[ ]'$  denotes  negative finding Cardiac  Comments:  Chest pain or chest pressure:    Shortness of breath upon exertion:    Short of breath when lying flat:    Irregular heart rhythm:        Vascular    Pain in calf, thigh, or hip brought on by ambulation:    Pain in feet at night that wakes you up from your sleep:     Blood clot in your veins:    Leg swelling:         Pulmonary    Oxygen at home:    Productive cough:     Wheezing:         Neurologic    Sudden weakness in arms or legs:     Sudden numbness in arms or legs:     Sudden onset of difficulty speaking or slurred speech:    Temporary loss of vision in one eye:     Problems with dizziness:         Gastrointestinal    Blood in stool:     Vomited blood:         Genitourinary    Burning when urinating:     Blood in urine:        Psychiatric    Major depression:         Hematologic    Bleeding problems:    Problems with blood clotting too easily:        Skin    Rashes or ulcers:        Constitutional    Fever or chills:      PHYSICAL EXAM: Vitals:   02/07/22 0908  BP: 128/86  Pulse: 80  Resp: 17  Temp: 97.9 F (36.6 C)  TempSrc: Temporal  SpO2: 95%  Weight: 155 lb (70.3 kg)  Height: 6' (1.829 m)    GENERAL: The patient is a well-nourished male, in no acute distress. The vital signs are documented above. CARDIAC: There is a regular rate and rhythm.  VASCULAR:  Palpable femoral pulses bilaterally Palpable DP pulses bilaterally PULMONARY: No respiratory distress. ABDOMEN: Soft and non-tender. MUSCULOSKELETAL: There are no major deformities or cyanosis. NEUROLOGIC: No focal weakness or paresthesias are detected. SKIN: There are no ulcers or rashes noted. PSYCHIATRIC: The patient has a normal affect.  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  The  SMA and IMA are widely patent.  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  I reviewed the CT images with the patient.  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  He has no associated symptoms including no nausea, vomiting, abdominal pain etc. consistent with median arcuate ligament syndrome.  Discussed that this is an incidental finding on CT and without symptoms there is no indication for intervention.  Discussed if he were to develop symptoms that would require likely a laparoscopic intervention by a general surgeon to decompress the ligament.  He can follow-up with me as needed.  Discussed this has nothing to do with his left flank pain that sounds very much musculoskeletal and he has point tenderness on exam along the rib cage.   Marty Heck, MD [facility] Office: [phone]

## 2022-08-15 ENCOUNTER — Other Ambulatory Visit: Payer: Self-pay | Admitting: Urology

## 2022-08-15 DIAGNOSIS — R972 Elevated prostate specific antigen [PSA]: Secondary | ICD-10-CM

## 2022-09-23 ENCOUNTER — Other Ambulatory Visit: Payer: 59

## 2022-10-19 ENCOUNTER — Encounter: Payer: Self-pay | Admitting: Urology

## 2022-10-21 ENCOUNTER — Ambulatory Visit
Admission: RE | Admit: 2022-10-21 | Discharge: 2022-10-21 | Disposition: A | Discharge status: Home / Self Care | Payer: Medicaid Other | Source: Ambulatory Visit | Attending: Urology | Admitting: Urology

## 2022-10-21 DIAGNOSIS — R972 Elevated prostate specific antigen [PSA]: Secondary | ICD-10-CM

## 2022-10-21 MED ORDER — GADOPICLENOL 0.5 MMOL/ML IV SOLN
7.0000 mL | Freq: Once | INTRAVENOUS | Status: AC | PRN
  Administered 2022-10-21: 7 mL via INTRAVENOUS

## 2022-11-01 DIAGNOSIS — Z970 Presence of artificial eye: Secondary | ICD-10-CM | POA: Diagnosis not present

## 2022-11-01 DIAGNOSIS — E785 Hyperlipidemia, unspecified: Secondary | ICD-10-CM | POA: Diagnosis not present

## 2022-11-01 DIAGNOSIS — I700 Atherosclerosis of aorta: Secondary | ICD-10-CM | POA: Diagnosis not present

## 2022-11-01 DIAGNOSIS — R070 Pain in throat: Secondary | ICD-10-CM | POA: Diagnosis not present

## 2022-11-01 DIAGNOSIS — R221 Localized swelling, mass and lump, neck: Secondary | ICD-10-CM | POA: Diagnosis not present

## 2022-11-01 DIAGNOSIS — N400 Enlarged prostate without lower urinary tract symptoms: Secondary | ICD-10-CM | POA: Diagnosis not present

## 2022-11-01 DIAGNOSIS — Z9079 Acquired absence of other genital organ(s): Secondary | ICD-10-CM | POA: Diagnosis not present

## 2022-11-01 DIAGNOSIS — I480 Paroxysmal atrial fibrillation: Secondary | ICD-10-CM | POA: Diagnosis not present

## 2022-11-10 DIAGNOSIS — R972 Elevated prostate specific antigen [PSA]: Secondary | ICD-10-CM | POA: Diagnosis not present

## 2022-11-10 DIAGNOSIS — R351 Nocturia: Secondary | ICD-10-CM | POA: Diagnosis not present

## 2023-04-27 DIAGNOSIS — N4232 Atypical small acinar proliferation of prostate: Secondary | ICD-10-CM | POA: Diagnosis not present

## 2023-04-27 DIAGNOSIS — R972 Elevated prostate specific antigen [PSA]: Secondary | ICD-10-CM | POA: Diagnosis not present

## 2023-04-27 DIAGNOSIS — N401 Enlarged prostate with lower urinary tract symptoms: Secondary | ICD-10-CM | POA: Diagnosis not present

## 2023-04-27 DIAGNOSIS — R351 Nocturia: Secondary | ICD-10-CM | POA: Diagnosis not present

## 2023-04-27 DIAGNOSIS — N5201 Erectile dysfunction due to arterial insufficiency: Secondary | ICD-10-CM | POA: Diagnosis not present

## 2023-05-09 DIAGNOSIS — C6991 Malignant neoplasm of unspecified site of right eye: Secondary | ICD-10-CM | POA: Diagnosis not present

## 2023-05-09 DIAGNOSIS — Z0000 Encounter for general adult medical examination without abnormal findings: Secondary | ICD-10-CM | POA: Diagnosis not present

## 2023-05-09 DIAGNOSIS — Z23 Encounter for immunization: Secondary | ICD-10-CM | POA: Diagnosis not present

## 2023-05-09 DIAGNOSIS — Z9079 Acquired absence of other genital organ(s): Secondary | ICD-10-CM | POA: Diagnosis not present

## 2023-05-09 DIAGNOSIS — Z970 Presence of artificial eye: Secondary | ICD-10-CM | POA: Diagnosis not present

## 2023-05-09 DIAGNOSIS — I480 Paroxysmal atrial fibrillation: Secondary | ICD-10-CM | POA: Diagnosis not present

## 2023-05-09 DIAGNOSIS — R070 Pain in throat: Secondary | ICD-10-CM | POA: Diagnosis not present

## 2023-05-09 DIAGNOSIS — R5382 Chronic fatigue, unspecified: Secondary | ICD-10-CM | POA: Diagnosis not present

## 2023-05-09 DIAGNOSIS — J312 Chronic pharyngitis: Secondary | ICD-10-CM | POA: Diagnosis not present

## 2023-05-09 DIAGNOSIS — N400 Enlarged prostate without lower urinary tract symptoms: Secondary | ICD-10-CM | POA: Diagnosis not present

## 2023-05-09 DIAGNOSIS — I700 Atherosclerosis of aorta: Secondary | ICD-10-CM | POA: Diagnosis not present

## 2023-05-09 DIAGNOSIS — E785 Hyperlipidemia, unspecified: Secondary | ICD-10-CM | POA: Diagnosis not present

## 2023-06-13 DIAGNOSIS — K219 Gastro-esophageal reflux disease without esophagitis: Secondary | ICD-10-CM | POA: Diagnosis not present

## 2023-06-13 DIAGNOSIS — J387 Other diseases of larynx: Secondary | ICD-10-CM | POA: Diagnosis not present

## 2023-06-13 DIAGNOSIS — J3503 Chronic tonsillitis and adenoiditis: Secondary | ICD-10-CM | POA: Diagnosis not present

## 2023-07-13 DIAGNOSIS — R09A2 Foreign body sensation, throat: Secondary | ICD-10-CM | POA: Diagnosis not present

## 2023-07-13 DIAGNOSIS — J3503 Chronic tonsillitis and adenoiditis: Secondary | ICD-10-CM | POA: Diagnosis not present

## 2023-07-13 DIAGNOSIS — K219 Gastro-esophageal reflux disease without esophagitis: Secondary | ICD-10-CM | POA: Diagnosis not present

## 2023-11-16 DIAGNOSIS — E559 Vitamin D deficiency, unspecified: Secondary | ICD-10-CM | POA: Diagnosis not present

## 2023-11-16 DIAGNOSIS — R946 Abnormal results of thyroid function studies: Secondary | ICD-10-CM | POA: Diagnosis not present

## 2023-11-16 DIAGNOSIS — R7989 Other specified abnormal findings of blood chemistry: Secondary | ICD-10-CM | POA: Diagnosis not present

## 2023-11-16 DIAGNOSIS — R5383 Other fatigue: Secondary | ICD-10-CM | POA: Diagnosis not present

## 2023-11-16 DIAGNOSIS — E785 Hyperlipidemia, unspecified: Secondary | ICD-10-CM | POA: Diagnosis not present

## 2023-11-30 IMAGING — CT CT CTA ABD/PEL W/CM AND/OR W/O CM
2 of 5 series · 13 of 46 positions shown, 15 images · IV contrast (Omni 300)
Comparison: CT abdomen pelvis with contrast 06/28/2016

CLINICAL DATA: Left upper quadrant abdominal pain

Dissection versus nephrolithiasis versus diverticulitis?
EXAM:
CTA ABDOMEN AND PELVIS WITHOUT AND WITH CONTRAST
TECHNIQUE: Multidetector CT imaging of the abdomen and pelvis was performed
using the standard protocol during bolus administration of
intravenous contrast. Multiplanar reconstructed images and MIPs were
obtained and reviewed to evaluate the vascular anatomy.

[Series 5: arterial 3.0 · axial · arterial · 0.77mm/px · z∈[+829,+1255]mm · 10 of 164 slices shown, 12 images]
[im 11/164  soft-tissue]
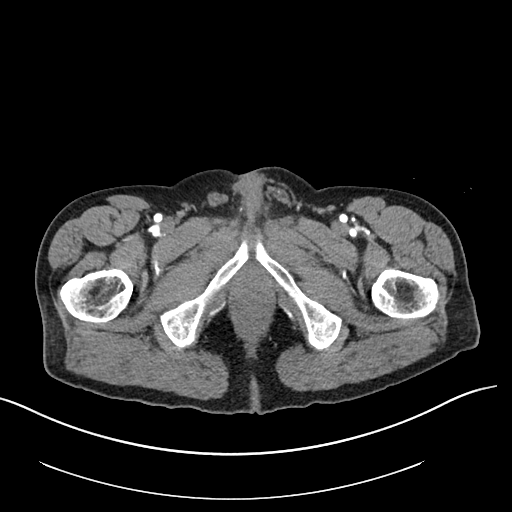
[im 11/164  bone]
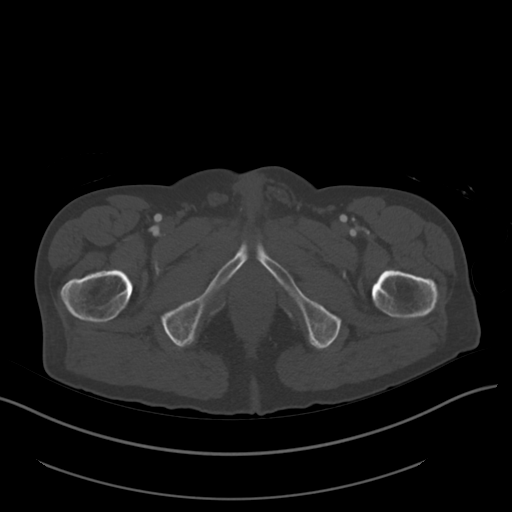
[im 27/164  soft-tissue]
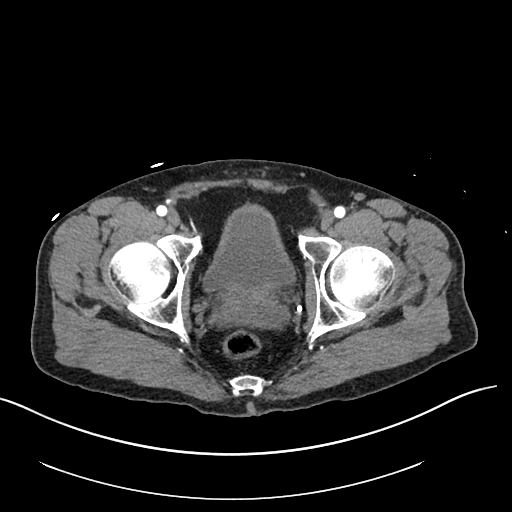
[im 43/164  soft-tissue]
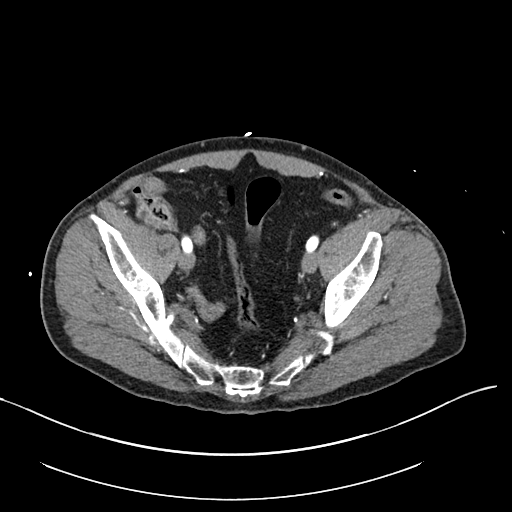
[im 58/164  soft-tissue]
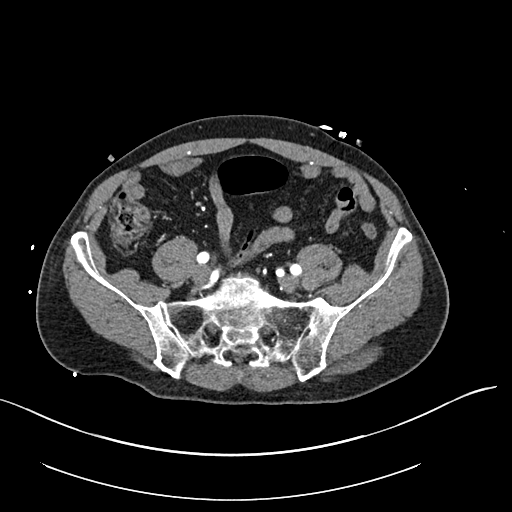
[im 74/164  soft-tissue]
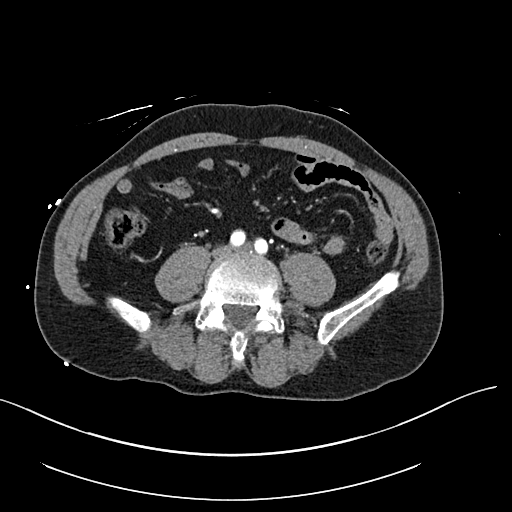
[im 90/164  soft-tissue]
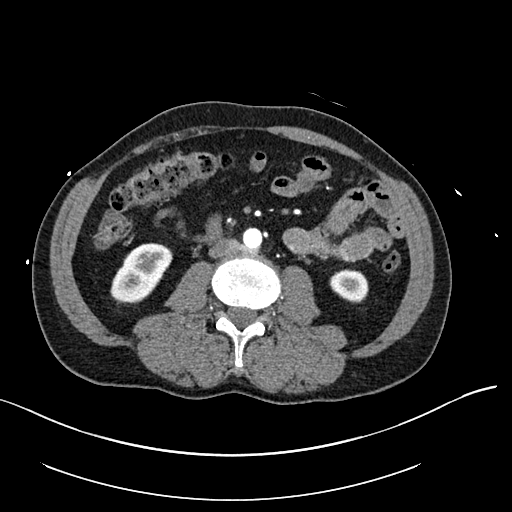
[im 106/164  soft-tissue]
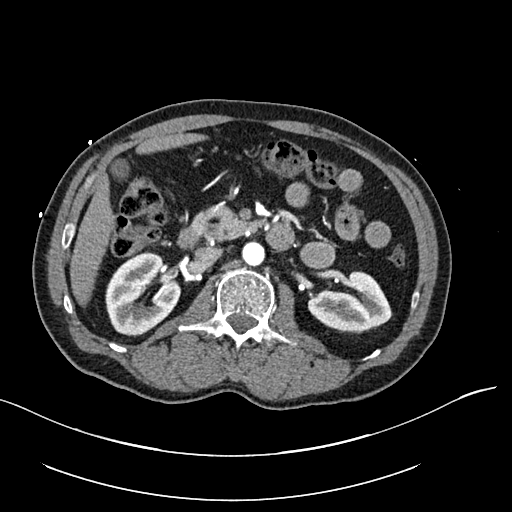
[im 121/164  soft-tissue]
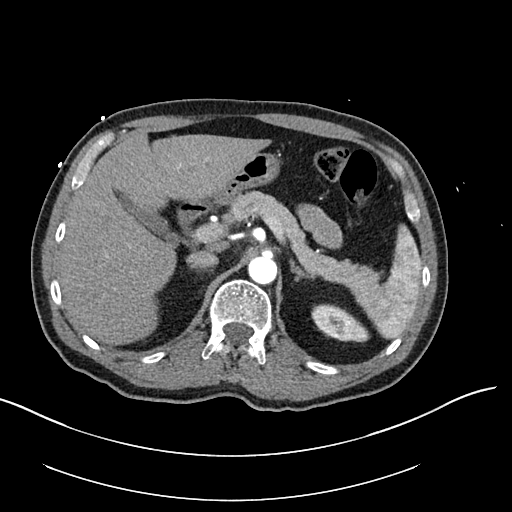
[im 137/164  soft-tissue]
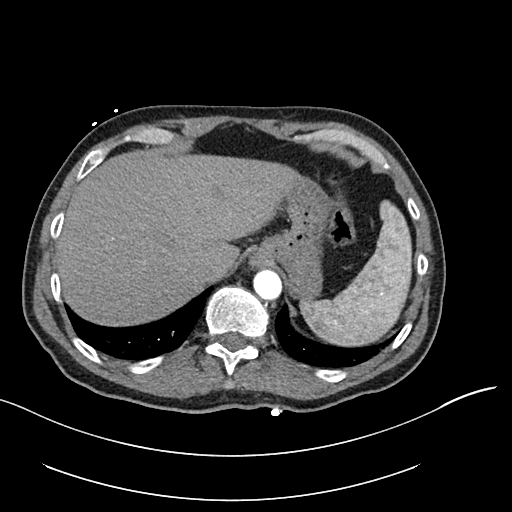
[im 137/164  bone]
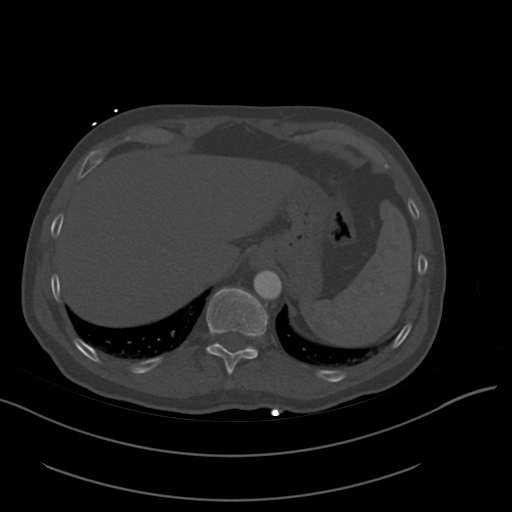
[im 153/164  soft-tissue]
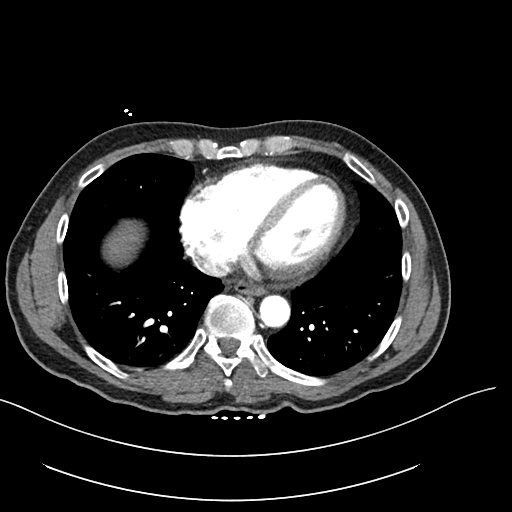

[Series 7: arterial cor · coronal · arterial · 0.59mm/px · 3 of 148 slices shown]
[im 37/148  soft-tissue]
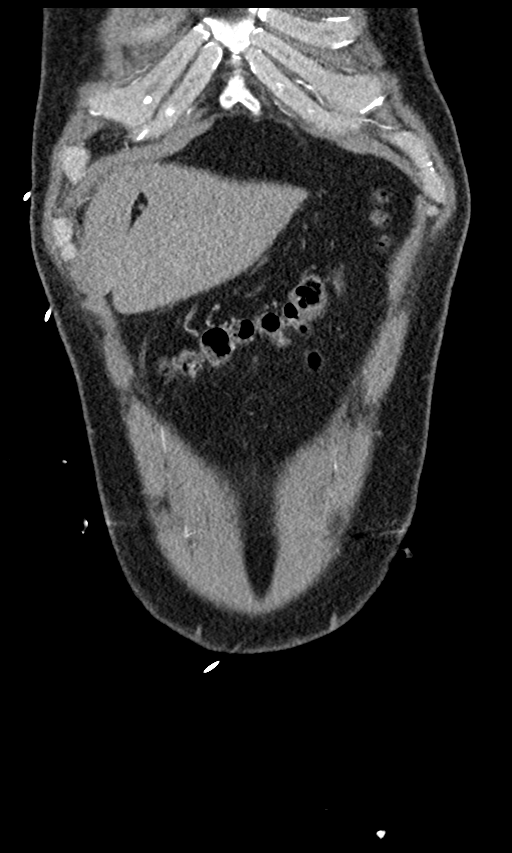
[im 74/148  soft-tissue]
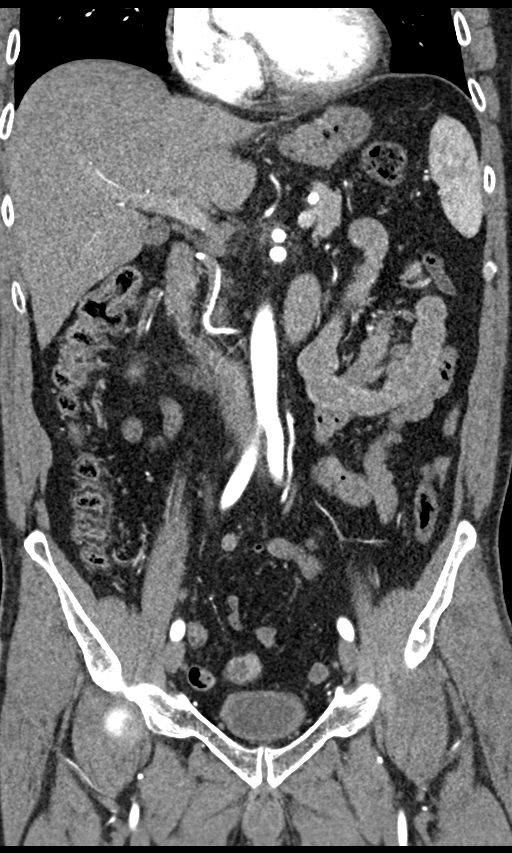
[im 111/148  soft-tissue]
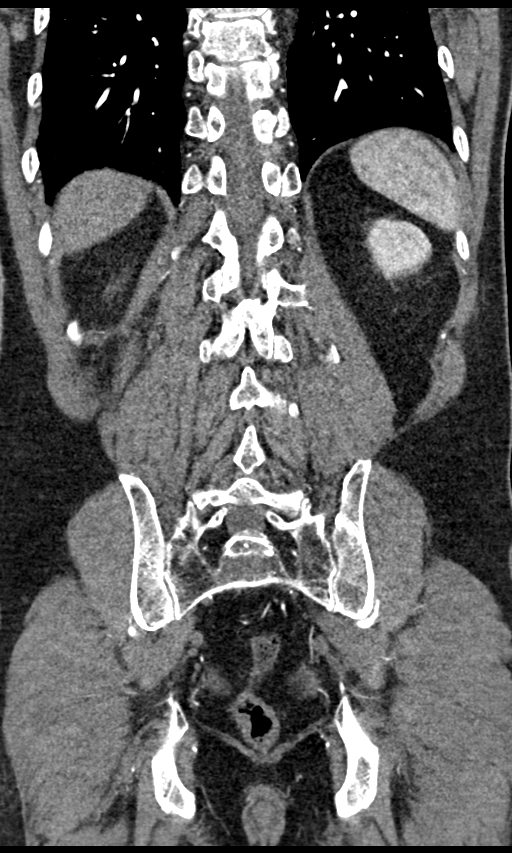

[13 of 46 positions shown; findings below may reference images not displayed]

RADIATION DOSE REDUCTION: This exam was performed according to the
departmental dose-optimization program which includes automated
exposure control, adjustment of the mA and/or kV according to
patient size and/or use of iterative reconstruction technique.

CONTRAST:  100mL OMNIPAQUE IOHEXOL 350 MG/ML SOLN
FINDINGS: VASCULAR

Aorta: Normal caliber aorta without aneurysm, dissection, vasculitis
or significant stenosis.

Celiac: Moderate to severe stenosis of the proximal celiac artery
best seen on series 8, image 82. Celiac artery and branches
otherwise patent.

SMA: Patent without evidence of aneurysm, dissection, vasculitis or
significant stenosis.

Renals: Both renal arteries are patent without evidence of aneurysm,
dissection, vasculitis, fibromuscular dysplasia or significant
stenosis.

IMA: Patent without evidence of aneurysm, dissection, vasculitis or
significant stenosis.

Inflow: Patent without evidence of aneurysm, dissection, vasculitis
or significant stenosis.

Proximal Outflow: Bilateral common femoral and visualized portions
of the superficial and profunda femoral arteries are patent without
evidence of aneurysm, dissection, vasculitis or significant
stenosis.

Veins: No obvious venous abnormality within the limitations of this
arterial phase study.

Review of the MIP images confirms the above findings.

NON-VASCULAR

Lower chest: 4 mm right middle lobe subpleural nodule in 5 mm right
lower lobe pulmonary nodule are not significantly changed in size
compared to prior exam from 06/28/2016 which indicates a benign
etiology. Lung bases are otherwise clear.

Hepatobiliary: No focal liver abnormality is seen. No gallstones,
gallbladder wall thickening, or biliary dilatation.

Pancreas: Unremarkable. No pancreatic ductal dilatation or
surrounding inflammatory changes.

Spleen: Normal in size without focal abnormality.

Adrenals/Urinary Tract: Adrenal glands are unremarkable. Kidneys are
normal, without renal calculi, focal lesion, or hydronephrosis.
Bladder is unremarkable.

Stomach/Bowel: Minimal hiatal hernia. Small duodenal diverticulum.
Mild sigmoid colon diverticulosis without evidence of acute
diverticulitis. Appendix is normal. No bowel dilatation to indicate
ileus or obstruction.

Lymphatic: No enlarged abdominal or pelvic lymph nodes.

Reproductive: Prostate is moderately enlarged. There is dilatation
of the prostatic urethra best seen on image 142 of series 5.

Other: No abdominal wall hernia or abnormality. No abdominopelvic
ascites.

Musculoskeletal: No acute or significant osseous findings.
IMPRESSION: VASCULAR

1. Moderate to severe stenosis of the proximal celiac artery
suspicious for median arcuate ligament syndrome.
2. No acute abnormality of the aorta.  No dissection.

NON-VASCULAR

1. Dilated prostatic urethra may be related to prior surgical
intervention such as TURP, please correlate with patient history. If
the patient has not had prior surgical intervention, this may be a
prostatic abscess.
2. Mild proximal sigmoid colon diverticulosis without evidence of
acute diverticulitis.
3. No nephrolithiasis.

## 2023-12-11 DIAGNOSIS — N401 Enlarged prostate with lower urinary tract symptoms: Secondary | ICD-10-CM | POA: Diagnosis not present

## 2023-12-11 DIAGNOSIS — R351 Nocturia: Secondary | ICD-10-CM | POA: Diagnosis not present

## 2023-12-11 DIAGNOSIS — N5201 Erectile dysfunction due to arterial insufficiency: Secondary | ICD-10-CM | POA: Diagnosis not present

## 2023-12-11 DIAGNOSIS — R972 Elevated prostate specific antigen [PSA]: Secondary | ICD-10-CM | POA: Diagnosis not present

## 2024-05-16 DIAGNOSIS — E785 Hyperlipidemia, unspecified: Secondary | ICD-10-CM | POA: Diagnosis not present

## 2024-05-16 DIAGNOSIS — R310 Gross hematuria: Secondary | ICD-10-CM | POA: Diagnosis not present

## 2024-05-16 DIAGNOSIS — Z23 Encounter for immunization: Secondary | ICD-10-CM | POA: Diagnosis not present

## 2024-05-16 DIAGNOSIS — R7989 Other specified abnormal findings of blood chemistry: Secondary | ICD-10-CM | POA: Diagnosis not present

## 2024-05-16 DIAGNOSIS — C6991 Malignant neoplasm of unspecified site of right eye: Secondary | ICD-10-CM | POA: Diagnosis not present

## 2024-05-16 DIAGNOSIS — Z970 Presence of artificial eye: Secondary | ICD-10-CM | POA: Diagnosis not present

## 2024-05-16 DIAGNOSIS — Z8679 Personal history of other diseases of the circulatory system: Secondary | ICD-10-CM | POA: Diagnosis not present

## 2024-05-16 DIAGNOSIS — L989 Disorder of the skin and subcutaneous tissue, unspecified: Secondary | ICD-10-CM | POA: Diagnosis not present

## 2024-05-16 DIAGNOSIS — Z9079 Acquired absence of other genital organ(s): Secondary | ICD-10-CM | POA: Diagnosis not present

## 2024-05-16 DIAGNOSIS — Z0000 Encounter for general adult medical examination without abnormal findings: Secondary | ICD-10-CM | POA: Diagnosis not present

## 2024-05-16 DIAGNOSIS — N400 Enlarged prostate without lower urinary tract symptoms: Secondary | ICD-10-CM | POA: Diagnosis not present

## 2024-05-16 DIAGNOSIS — I700 Atherosclerosis of aorta: Secondary | ICD-10-CM | POA: Diagnosis not present

## 2024-06-02 DIAGNOSIS — R972 Elevated prostate specific antigen [PSA]: Secondary | ICD-10-CM | POA: Diagnosis not present

## 2024-06-09 DIAGNOSIS — R972 Elevated prostate specific antigen [PSA]: Secondary | ICD-10-CM | POA: Diagnosis not present

## 2024-06-09 DIAGNOSIS — N401 Enlarged prostate with lower urinary tract symptoms: Secondary | ICD-10-CM | POA: Diagnosis not present

## 2024-06-09 DIAGNOSIS — R310 Gross hematuria: Secondary | ICD-10-CM | POA: Diagnosis not present

## 2024-06-09 DIAGNOSIS — N5203 Combined arterial insufficiency and corporo-venous occlusive erectile dysfunction: Secondary | ICD-10-CM | POA: Diagnosis not present

## 2024-06-27 DIAGNOSIS — R310 Gross hematuria: Secondary | ICD-10-CM | POA: Diagnosis not present

## 2024-08-11 ENCOUNTER — Ambulatory Visit: Admitting: Dermatology

## 2024-08-11 ENCOUNTER — Encounter: Payer: Self-pay | Admitting: Dermatology

## 2024-08-11 DIAGNOSIS — Z85820 Personal history of malignant melanoma of skin: Secondary | ICD-10-CM | POA: Diagnosis not present

## 2024-08-11 DIAGNOSIS — L570 Actinic keratosis: Secondary | ICD-10-CM | POA: Diagnosis not present

## 2024-08-11 DIAGNOSIS — L578 Other skin changes due to chronic exposure to nonionizing radiation: Secondary | ICD-10-CM

## 2024-08-11 DIAGNOSIS — W908XXA Exposure to other nonionizing radiation, initial encounter: Secondary | ICD-10-CM

## 2024-08-11 NOTE — Patient Instructions (Addendum)

## 2024-08-11 NOTE — Progress Notes (Signed)
 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  He has a spot on his scalp that has been present since mid-last year. Initially, it was about the size of a pencil eraser and was tender. Over time, the spot has decreased in size and now feels almost gone. However, it has not been healing properly, prompting this visit. No recent changes in size, but the spot remains tender.  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 The melanoma was a tumor inside the eye, initially suspected to have torn his retina. He experienced 'little flashes' in his vision but no pain. Following diagnosis, he underwent surgery at [facility] to remove the eye and had an implant placed. He has been following up with blood work since then.  He mentions having numerous freckles and a mole on his chest that has grown from the size of a pinhead to the size of an eraser, particularly noticeable in the summer when he sweats.  Patient states he  has scalp located at the scalp that he  would like to have examined. Patient reports the areas have been there for 4 month.  Patient reports that it was originally larger than a pencil eraser but has gotten smaller in size. Patient reports he has not previously been treated for these areas.   Patient had melanoma in 2006 and lost his right eye. His eye doctor noticed a torn retina and was sent to [facility] where they noticed that the tumor had grown large enough to tear his retina. Patient followed up for 5 years for continued blood work. He has never seen a dermatologist.   The following portions of the chart were reviewed this encounter and updated as appropriate: medications, allergies, medical history  Review of Systems:  No other skin or systemic complaints except as noted in HPI or Assessment and  Plan.  Objective  Well appearing patient in no apparent distress; mood and affect are within normal limits.   A focused examination was performed of the following areas: scalp  Relevant exam findings are noted in the Assessment and Plan.  Mid Parietal Scalp Erythematous thin papules/macules with gritty scale.   Assessment & Plan   ACTINIC DAMAGE - chronic, secondary to cumulative UV radiation exposure/sun exposure over time - diffuse scaly erythematous macules with underlying dyspigmentation - Recommend daily broad spectrum sunscreen SPF 30+ to sun-exposed areas, reapply every 2 hours as needed.  - Recommend staying in the shade or wearing long sleeves, sun glasses (UVA+UVB protection) and wide brim hats (4-inch brim around the entire circumference of the hat). - Call for new or changing lesions.   HISTORY OF OCULAR MELANOMA- RIGHT EYE  - Eye completely removed at [facility].  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  AK (ACTINIC KERATOSIS) Mid Parietal Scalp - Destruction of lesion - Mid Parietal Scalp Complexity: simple   Destruction method: cryotherapy   Informed consent: discussed and consent obtained   Timeout:  patient name, date of birth, surgical site, and procedure verified Lesion destroyed using liquid nitrogen: Yes   Region frozen  until ice ball extended beyond lesion: Yes   Outcome: patient tolerated procedure well with no complications   Post-procedure details: wound care instructions given     Return in about 2 months (around 10/09/2024) for FBSE and AK follow up.  LILLETTE Rollene Gobble, RN, am acting as scribe for RUFUS CHRISTELLA HOLY, MD .   Documentation: I have reviewed the above documentation for accuracy and completeness, and I  agree with the above.  RUFUS CHRISTELLA HOLY, MD     "

## 2024-10-15 ENCOUNTER — Encounter: Admitting: Dermatology
